# Patient Record
Sex: Female | Born: 1975
Health system: Southern US, Community
[De-identification: ages and names within clinical notes are randomized; demographics above are authoritative.]

## PROBLEM LIST (undated history)

## (undated) DIAGNOSIS — G4700 Insomnia, unspecified: Secondary | ICD-10-CM

## (undated) DIAGNOSIS — F329 Major depressive disorder, single episode, unspecified: Secondary | ICD-10-CM

## (undated) DIAGNOSIS — F32A Depression, unspecified: Secondary | ICD-10-CM

## (undated) DIAGNOSIS — F419 Anxiety disorder, unspecified: Secondary | ICD-10-CM

## (undated) DIAGNOSIS — E079 Disorder of thyroid, unspecified: Secondary | ICD-10-CM

## (undated) DIAGNOSIS — F3172 Bipolar disorder, in full remission, most recent episode hypomanic: Secondary | ICD-10-CM

## (undated) DIAGNOSIS — G629 Polyneuropathy, unspecified: Secondary | ICD-10-CM

## (undated) HISTORY — DX: Insomnia, unspecified: G47.00

## (undated) HISTORY — DX: Anxiety disorder, unspecified: F41.9

## (undated) HISTORY — DX: Polyneuropathy, unspecified: G62.9

## (undated) HISTORY — DX: Major depressive disorder, single episode, unspecified: F32.9

## (undated) HISTORY — DX: Bipolar disorder, in full remission, most recent episode hypomanic: F31.72

## (undated) HISTORY — DX: Depression, unspecified: F32.A

## (undated) HISTORY — PX: TUBAL LIGATION: SHX77

## (undated) HISTORY — DX: Disorder of thyroid, unspecified: E07.9

## (undated) HISTORY — PX: ABLATION: SHX5711

## (undated) HISTORY — PX: APPENDECTOMY: SHX54

---

## 2006-01-05 ENCOUNTER — Emergency Department (HOSPITAL_COMMUNITY): Admission: EM | Admit: 2006-01-05 | Discharge: 2006-01-05 | Payer: Self-pay | Admitting: Family Medicine

## 2013-10-23 ENCOUNTER — Ambulatory Visit: Payer: Self-pay | Admitting: Internal Medicine

## 2013-10-23 ENCOUNTER — Ambulatory Visit (INDEPENDENT_AMBULATORY_CARE_PROVIDER_SITE_OTHER): Payer: BC Managed Care – PPO | Admitting: Internal Medicine

## 2013-10-23 ENCOUNTER — Encounter: Payer: Self-pay | Admitting: Family Medicine

## 2013-10-23 ENCOUNTER — Encounter: Payer: Self-pay | Admitting: Internal Medicine

## 2013-10-23 VITALS — BP 110/70 | HR 94 | Temp 97.7°F | Ht 65.0 in | Wt 199.0 lb

## 2013-10-23 DIAGNOSIS — Z8261 Family history of arthritis: Secondary | ICD-10-CM

## 2013-10-23 DIAGNOSIS — Z0000 Encounter for general adult medical examination without abnormal findings: Secondary | ICD-10-CM

## 2013-10-23 DIAGNOSIS — Z840 Family history of diseases of the skin and subcutaneous tissue: Secondary | ICD-10-CM

## 2013-10-23 LAB — COMPREHENSIVE METABOLIC PANEL
ALT: 16 U/L (ref 0–35)
AST: 20 U/L (ref 0–37)
Alkaline Phosphatase: 86 U/L (ref 39–117)
Sodium: 138 mEq/L (ref 135–145)
Total Bilirubin: 0.5 mg/dL (ref 0.3–1.2)
Total Protein: 7.5 g/dL (ref 6.0–8.3)

## 2013-10-23 LAB — CBC
HCT: 38.6 % (ref 36.0–46.0)
MCHC: 32.9 g/dL (ref 30.0–36.0)
MCV: 78.7 fl (ref 78.0–100.0)
RDW: 15.9 % — ABNORMAL HIGH (ref 11.5–14.6)

## 2013-10-23 LAB — RHEUMATOID FACTOR: Rhuematoid fact SerPl-aCnc: <10 IU/mL (ref <=14)

## 2013-10-23 LAB — LIPID PANEL
Cholesterol: 176 mg/dL (ref 0–200)
LDL Cholesterol: 97 mg/dL (ref 0–99)
VLDL: 39.6 mg/dL (ref 0.0–40.0)

## 2013-10-23 LAB — TSH: TSH: 1.96 u[IU]/mL (ref 0.35–5.50)

## 2013-10-23 NOTE — Progress Notes (Signed)
HPI  Pt presents to the clinic today to establish care. She has no concerns today. She does have a remote history of thyroid disease and would like to have her thyroid checked.  Flu: 09/2013 Tetanus: 2014 LMP: 09/2013 Pap Smear: 2012  History reviewed. No pertinent past medical history.  Current Outpatient Prescriptions  Medication Sig Dispense Refill  . sertraline (ZOLOFT) 100 MG tablet Take 100 mg by mouth daily.        No current facility-administered medications for this visit.    No Known Allergies  History reviewed. No pertinent family history.  History   Social History  . Marital Status: Married    Spouse Name: N/A    Number of Children: N/A  . Years of Education: N/A   Occupational History  . Not on file.   Social History Main Topics  . Smoking status: Former Smoker    Types: Cigarettes    Quit date: 12/05/2007  . Smokeless tobacco: Never Used  . Alcohol Use: Yes  . Drug Use: No  . Sexual Activity: Not on file   Other Topics Concern  . Not on file   Social History Narrative  . No narrative on file    ROS:  Constitutional: Pt reports fatigue. Denies fever, malaise, headache or abrupt weight changes.  HEENT: Denies eye pain, eye redness, ear pain, ringing in the ears, wax buildup, runny nose, nasal congestion, bloody nose, or sore throat. Respiratory: Denies difficulty breathing, shortness of breath, cough or sputum production.   Cardiovascular: Denies chest pain, chest tightness, palpitations or swelling in the hands or feet.  Gastrointestinal: Denies abdominal pain, bloating, constipation, diarrhea or blood in the stool.  GU: Denies frequency, urgency, pain with urination, blood in urine, odor or discharge. Musculoskeletal: Pt reports low back pain. Denies decrease in range of motion, difficulty with gait, or joint pain and swelling.  Skin: Denies redness, rashes, lesions or ulcercations.  Neurological: Denies dizziness, difficulty with memory,  difficulty with speech or problems with balance and coordination.   No other specific complaints in a complete review of systems (except as listed in HPI above).  PE:  BP 110/70  Pulse 94  Temp(Src) 97.7 F (36.5 C) (Tympanic)  Ht 5\' 5"  (1.651 m)  Wt 199 lb (90.266 kg)  BMI 33.12 kg/m2  SpO2 99% Wt Readings from Last 3 Encounters:  10/23/13 199 lb (90.266 kg)    General: Appears her stated age, obese but well developed, well nourished in NAD. HEENT: Head: normal shape and size; Eyes: sclera white, no icterus, conjunctiva pink, PERRLA and EOMs intact; Ears: Tm's gray and intact, normal light reflex; Nose: mucosa pink and moist, septum midline; Throat/Mouth: Teeth present, mucosa pink and moist, no lesions or ulcerations noted.  Neck: Normal range of motion. Neck supple, trachea midline. No massses, lumps or thyromegaly present.  Cardiovascular: Normal rate and rhythm. S1,S2 noted.  No murmur, rubs or gallops noted. No JVD or BLE edema. No carotid bruits noted. Pulmonary/Chest: Normal effort and positive vesicular breath sounds. No respiratory distress. No wheezes, rales or ronchi noted.  Abdomen: Soft and nontender. Normal bowel sounds, no bruits noted. No distention or masses noted. Liver, spleen and kidneys non palpable. Musculoskeletal: Normal range of motion. No signs of joint swelling. No difficulty with gait.  Neurological: Alert and oriented. Cranial nerves II-XII intact. Coordination normal. +DTRs bilaterally. Psychiatric: Mood and affect normal. Behavior is normal. Judgment and thought content normal.      Assessment and Plan:  Preventative Health  Maintenance:  Will obtain screening labs today Pt will call her GYN to get her pap smear this year Try to work on your diet and exercise  Will check RF and ANA   RTC in 1 year for your annual exam

## 2013-10-23 NOTE — Progress Notes (Signed)
Pre-visit discussion using our clinic review tool. No additional management support is needed unless otherwise documented below in the visit note.  

## 2013-10-23 NOTE — Patient Instructions (Signed)

## 2013-10-24 LAB — ANTI-NUCLEAR AB-TITER (ANA TITER): ANA Titer 1: 1:40 {titer} — ABNORMAL HIGH

## 2013-10-27 ENCOUNTER — Encounter: Payer: Self-pay | Admitting: Family Medicine

## 2013-10-28 ENCOUNTER — Encounter: Payer: Self-pay | Admitting: *Deleted

## 2013-11-03 ENCOUNTER — Encounter: Payer: Self-pay | Admitting: Internal Medicine

## 2013-11-05 ENCOUNTER — Encounter: Payer: Self-pay | Admitting: Family Medicine

## 2013-11-05 ENCOUNTER — Ambulatory Visit (INDEPENDENT_AMBULATORY_CARE_PROVIDER_SITE_OTHER): Payer: BC Managed Care – PPO | Admitting: Family Medicine

## 2013-11-05 VITALS — BP 110/80 | HR 86 | Temp 98.1°F | Wt 197.0 lb

## 2013-11-05 DIAGNOSIS — R071 Chest pain on breathing: Secondary | ICD-10-CM

## 2013-11-05 DIAGNOSIS — R0789 Other chest pain: Secondary | ICD-10-CM

## 2013-11-05 NOTE — Progress Notes (Signed)
Pre-visit discussion using our clinic review tool. No additional management support is needed unless otherwise documented below in the visit note.  Pain inferior to the L breast.  Only on the L side.  No SSCP.  L triceps is occ sore.  Chest pain with deep inspiration and certain movements.  She can reproduce the pain.  No rash.  No trauma. No R sided pain.  No bruising.  She can get sob with exertion, unclear if this is related.  She is tired.  No heartburn.  No early CAD in the family.  No early death in the family.  No cocaine. No personal hx heart disease.  No syncope.  Not on birth control pills. No personal h/o DVT.  No calf swelling.   Meds, vitals, and allergies reviewed.   ROS: See HPI.  Otherwise, noncontributory.  nad ncat OP exam wnl Neck supple, no LA rrr ctab abd soft Back not ttp ttp just inferior to the L breast.  Pain at same spot with deep inspiration, better with compression during inhalation.  Calf not ttp x2, 40cm calf B

## 2013-11-05 NOTE — Patient Instructions (Signed)
Take ibuprofen with food and this should slowly get better.  Take care.  This looks to be a chest wall irritation.

## 2013-11-06 DIAGNOSIS — R0789 Other chest pain: Secondary | ICD-10-CM | POA: Insufficient documentation

## 2013-11-06 NOTE — Assessment & Plan Note (Signed)
Likely benign process. ddx d/w pt.  F/u prn.  Ibuprofen in meantime.  Well appearing.

## 2014-07-06 ENCOUNTER — Telehealth: Payer: Self-pay | Admitting: Internal Medicine

## 2014-07-06 ENCOUNTER — Encounter: Payer: Self-pay | Admitting: Family Medicine

## 2014-07-06 ENCOUNTER — Ambulatory Visit (INDEPENDENT_AMBULATORY_CARE_PROVIDER_SITE_OTHER): Payer: BC Managed Care – PPO | Admitting: Family Medicine

## 2014-07-06 VITALS — BP 90/70 | HR 91 | Temp 98.9°F | Ht 65.0 in | Wt 200.0 lb

## 2014-07-06 DIAGNOSIS — R079 Chest pain, unspecified: Secondary | ICD-10-CM

## 2014-07-06 DIAGNOSIS — M940 Chondrocostal junction syndrome [Tietze]: Secondary | ICD-10-CM

## 2014-07-06 MED ORDER — METHYLPREDNISOLONE 4 MG PO KIT: PACK | ORAL | Status: AC

## 2014-07-06 NOTE — Telephone Encounter (Signed)
Patient Information:  Caller Name: Raynelle FanningJulie  Phone: [phone]  Patient: Allison [name], [name]  Gender: Female  DOB: Feb 15, 1976  Age: 38 Years  PCP: Nicki ReaperBaity, Regina  Pregnant: No  Office Follow Up:  Does the office need to follow up with this patient?: No  Instructions For The Office: N/A  RN Note:  Unable to get appointment at [facility]. Will go to [facility] today.  Symptoms  Reason For Call & Symptoms: Has had Hx of neck and back pain considered mild  and  intermittent for about  2 years, does not remember an injury.  More frequent headaches for the last 2 weeks. Neck and back pain has been much worse since Saturday 8/1, can't get comfortable, can't rest, can't sit without pain in neck at shoulders and down to mid-back.  Would describe as moderate pain but noting some weakness in both arms, left worse - had problem opening can last night 8/2 and some tingle in left arm.  Reviewed Health History In EMR: Yes  Reviewed Medications In EMR: Yes  Reviewed Allergies In EMR: Yes  Reviewed Surgeries / Procedures: Yes  Date of Onset of Symptoms: 07/04/2014  Treatments Tried: ice   tylenol   neck rub  Treatments Tried Worked: No OB / GYN:  LMP: 06/10/2014  Guideline(s) Used:  Neck Pain or Stiffness  Disposition Per Guideline:   See Today in Office  Reason For Disposition Reached:   Numbness in an arm or hand (i.e., loss of sensation)  Advice Given:  N/A  Patient Will Follow Care Advice:  YES  Appointment Scheduled:  07/06/2014 15:00:00 Appointment Scheduled Provider: Dr Clent RidgesFry  Other

## 2014-07-06 NOTE — Progress Notes (Signed)
Pre visit review using our clinic review tool, if applicable. No additional management support is needed unless otherwise documented below in the visit note. 

## 2014-07-06 NOTE — Progress Notes (Signed)
   Subjective:    Patient ID: Maris BergerJulie A Kissick, female    DOB: [birth year], 38 y.o.   MRN: [id number]  HPI Here for 2 days of sharp intermittent chest pain which starts on both sides of the sternum and may radiate around both sides to the back. No SOB or cough. She has tried Ibuprofen with mixed results.    Review of Systems  Constitutional: Negative.   Respiratory: Negative.   Cardiovascular: Positive for chest pain. Negative for palpitations and leg swelling.       Objective:   Physical Exam  Constitutional: She appears well-developed and well-nourished. No distress.  Cardiovascular: Normal rate, regular rhythm, normal heart sounds and intact distal pulses.   EKG normal   Pulmonary/Chest: Effort normal and breath sounds normal. No respiratory distress. She has no wheezes. She has no rales.  She is tender along both sternal margins           Assessment & Plan:  Rest, heat. Given a Medrol dose pack

## 2014-07-07 ENCOUNTER — Ambulatory Visit: Payer: BC Managed Care – PPO | Admitting: Internal Medicine

## 2014-07-15 ENCOUNTER — Encounter: Payer: Self-pay | Admitting: Family Medicine

## 2014-07-15 NOTE — Telephone Encounter (Signed)
Make a follow up OV to discuss our next steps

## 2014-09-09 ENCOUNTER — Ambulatory Visit (INDEPENDENT_AMBULATORY_CARE_PROVIDER_SITE_OTHER): Payer: BC Managed Care – PPO

## 2014-09-09 DIAGNOSIS — Z23 Encounter for immunization: Secondary | ICD-10-CM

## 2014-10-08 ENCOUNTER — Ambulatory Visit: Payer: BC Managed Care – PPO | Admitting: Internal Medicine

## 2014-11-13 ENCOUNTER — Ambulatory Visit (INDEPENDENT_AMBULATORY_CARE_PROVIDER_SITE_OTHER): Payer: BC Managed Care – PPO | Admitting: Internal Medicine

## 2014-11-13 ENCOUNTER — Encounter: Payer: Self-pay | Admitting: Internal Medicine

## 2014-11-13 VITALS — BP 110/70 | HR 88 | Resp 12 | Ht 65.0 in | Wt 202.0 lb

## 2014-11-13 DIAGNOSIS — F4322 Adjustment disorder with anxiety: Secondary | ICD-10-CM

## 2014-11-13 DIAGNOSIS — F411 Generalized anxiety disorder: Secondary | ICD-10-CM | POA: Insufficient documentation

## 2014-11-13 MED ORDER — CLONAZEPAM 0.5 MG PO TABS: 0.5000 mg | ORAL_TABLET | Freq: Three times a day (TID) | ORAL | Status: DC | PRN

## 2014-11-13 MED ORDER — BUPROPION HCL ER (XL) 150 MG PO TB24: 150.0000 mg | ORAL_TABLET | Freq: Every day | ORAL | Status: DC

## 2014-11-13 NOTE — Assessment & Plan Note (Signed)
Recent worsening symptoms of anxiety, exacerbated by stressors at home caring for 3 young children. She has not tolerated escalation of Sertraline dose in the past. Will try adding Wellbutrin. Will also add Clonazepam tid prn. Discussed potential risks of these medications. Will set up follow up with psychiatry for evaluation and counseling. Follow up in 4 week and prn.

## 2014-11-13 NOTE — Patient Instructions (Signed)
Start Wellbutrin 150mg  daily in the morning.  Start Clonazepam 0.5mg  up to three times daily for anxiety.  We will set up evaluation with psychiatry.  Follow up in 4 weeks.

## 2014-11-13 NOTE — Progress Notes (Signed)
Pre visit review using our clinic review tool, if applicable. No additional management support is needed unless otherwise documented below in the visit note. 

## 2014-11-13 NOTE — Progress Notes (Signed)
Subjective:    Patient ID: Allison BergerJulie A Capelli, female    DOB: 1976/07/02, 38 y.o.   MRN: [id number]  HPI 38YO female presents for acute visit.  Started sertraline initially for post-partum depression. Has been on Sertraline for several years. Long history of depression symptoms, as far back as high school. Depression symptoms have been generally well controlled. However over last 1-2 months, feeling anxious and angry frequently. Having some behavioral issues with 37 year old, and notes increased stress at home caring for 22, 344 and 37 year old. Feels safe at home. Husband is supportive. Having frequent panic attacks with heart racing, sweating. Occur almost daily. Not taking anything for this.  Tried increasing Sertraline in the past, and had agitation. Unable to tolerate other SSRIs and Effexor.  Also stopped birth control Dec 2012. Had tubal ligation. Questions if hormones may be playing a role.   Past medical, surgical, family and social history per today's encounter.  Review of Systems  Constitutional: Negative for fever, chills, appetite change, fatigue and unexpected weight change.  Eyes: Negative for visual disturbance.  Respiratory: Negative for shortness of breath.   Cardiovascular: Positive for palpitations. Negative for chest pain and leg swelling.  Gastrointestinal: Negative for abdominal pain.  Skin: Negative for color change and rash.  Hematological: Negative for adenopathy. Does not bruise/bleed easily.  Psychiatric/Behavioral: Positive for sleep disturbance, dysphoric mood and agitation. Negative for suicidal ideas, hallucinations and self-injury. The patient is nervous/anxious.        Objective:    BP 110/70 mmHg  Pulse 88  Resp 12  Ht 5\' 5"  (1.651 m)  Wt 202 lb (91.627 kg)  BMI 33.61 kg/m2  SpO2 98% Physical Exam  Constitutional: She is oriented to person, place, and time. She appears well-developed and well-nourished. No distress.  HENT:  Head: Normocephalic  and atraumatic.  Right Ear: External ear normal.  Left Ear: External ear normal.  Nose: Nose normal.  Mouth/Throat: Oropharynx is clear and moist.  Eyes: Conjunctivae are normal. Pupils are equal, round, and reactive to light. Right eye exhibits no discharge. Left eye exhibits no discharge. No scleral icterus.  Neck: Normal range of motion. Neck supple. No tracheal deviation present. No thyromegaly present.  Cardiovascular: Normal rate, regular rhythm, normal heart sounds and intact distal pulses.  Exam reveals no gallop and no friction rub.   No murmur heard. Pulmonary/Chest: Effort normal and breath sounds normal. No accessory muscle usage. No tachypnea. No respiratory distress. She has no decreased breath sounds. She has no wheezes. She has no rhonchi. She has no rales. She exhibits no tenderness.  Musculoskeletal: Normal range of motion. She exhibits no edema or tenderness.  Lymphadenopathy:    She has no cervical adenopathy.  Neurological: She is alert and oriented to person, place, and time. No cranial nerve deficit. She exhibits normal muscle tone. Coordination normal.  Skin: Skin is warm and dry. No rash noted. She is not diaphoretic. No erythema. No pallor.  Psychiatric: Her behavior is normal. Judgment and thought content normal. Her mood appears anxious. Cognition and memory are normal. She exhibits a depressed mood. She expresses no suicidal ideation.          Assessment & Plan:  Over 25min of which >50% spent in face-to-face contact with patient discussing plan of care  Problem List Items Addressed This Visit      Unprioritized   Adjustment disorder with anxious mood - Primary    Recent worsening symptoms of anxiety, exacerbated by stressors  at home caring for 3 young children. She has not tolerated escalation of Sertraline dose in the past. Will try adding Wellbutrin. Will also add Clonazepam tid prn. Discussed potential risks of these medications. Will set up follow up  with psychiatry for evaluation and counseling. Follow up in 4 week and prn.    Relevant Medications      buPROPion (WELLBUTRIN XL) 24 hr tablet      clonazePAM (KLONOPIN)  tablet   Other Relevant Orders      Ambulatory referral to Psychiatry       Return in about 4 weeks (around 12/11/2014) for Recheck.

## 2014-11-17 ENCOUNTER — Encounter: Payer: Self-pay | Admitting: Internal Medicine

## 2014-12-17 ENCOUNTER — Encounter: Payer: Self-pay | Admitting: Internal Medicine

## 2014-12-17 ENCOUNTER — Ambulatory Visit (INDEPENDENT_AMBULATORY_CARE_PROVIDER_SITE_OTHER): Payer: BLUE CROSS/BLUE SHIELD | Admitting: Internal Medicine

## 2014-12-17 VITALS — BP 114/72 | HR 88 | Temp 98.2°F | Wt 197.0 lb

## 2014-12-17 DIAGNOSIS — R1030 Lower abdominal pain, unspecified: Secondary | ICD-10-CM

## 2014-12-17 DIAGNOSIS — R1011 Right upper quadrant pain: Secondary | ICD-10-CM

## 2014-12-17 DIAGNOSIS — Z8639 Personal history of other endocrine, nutritional and metabolic disease: Secondary | ICD-10-CM

## 2014-12-17 LAB — POCT URINALYSIS DIPSTICK
Bilirubin, UA: NEGATIVE
Glucose, UA: NEGATIVE
Ketones, UA: NEGATIVE
Nitrite, UA: NEGATIVE
Protein, UA: NEGATIVE
RBC UA: NEGATIVE
SPEC GRAV UA: 1.015
Urobilinogen, UA: NEGATIVE
pH, UA: 6

## 2014-12-17 LAB — COMPREHENSIVE METABOLIC PANEL
ALBUMIN: 4.4 g/dL (ref 3.5–5.2)
ALK PHOS: 105 U/L (ref 39–117)
ALT: 13 U/L (ref 0–35)
AST: 17 U/L (ref 0–37)
BUN: 12 mg/dL (ref 6–23)
CHLORIDE: 102 meq/L (ref 96–112)
CO2: 30 meq/L (ref 19–32)
Calcium: 9.7 mg/dL (ref 8.4–10.5)
Creatinine, Ser: 0.84 mg/dL (ref 0.40–1.20)
GFR: 81.06 mL/min (ref >60.00)
Glucose, Bld: 90 mg/dL (ref 70–99)
POTASSIUM: 3.9 meq/L (ref 3.5–5.1)
Sodium: 137 mEq/L (ref 135–145)
Total Bilirubin: 0.3 mg/dL (ref 0.2–1.2)
Total Protein: 8 g/dL (ref 6.0–8.3)

## 2014-12-17 LAB — CBC
HCT: 39.5 % (ref 36.0–46.0)
Hemoglobin: 12.9 g/dL (ref 12.0–15.0)
MCHC: 32.6 g/dL (ref 30.0–36.0)
MCV: 79.6 fl (ref 78.0–100.0)
Platelets: 338 10*3/uL (ref 150.0–400.0)
RBC: 4.96 Mil/uL (ref 3.87–5.11)
RDW: 15.2 % (ref 11.5–15.5)
WBC: 9.1 10*3/uL (ref 4.0–10.5)

## 2014-12-17 LAB — AMYLASE: Amylase: 45 U/L (ref 27–131)

## 2014-12-17 LAB — TSH: TSH: 1.99 u[IU]/mL (ref 0.35–4.50)

## 2014-12-17 LAB — LIPASE: LIPASE: 13 U/L (ref 11.0–59.0)

## 2014-12-17 NOTE — Patient Instructions (Signed)

## 2014-12-17 NOTE — Progress Notes (Signed)
HPI  [name] is a 39 y.o. female presenting to the clinic today with c/o intermittent nausea and abdominal pain x 2-3 months, increasing in frequency. Both the nausea and abdominal pain are usually in the evenings and occur approximately 2-4x per week.  The abdominal pain is dull and on a scale of 1-10, she reports a 3/10. She is having a BM 6-7x per week; normal in color and consistency. The nausea and abdominal pain is preventing her from eating dinner. Sometimes the pain is accompanied by burning in the middle of her upper chest region. She has tried Tums w/ minimal relief.  She reports a hx of Grave's disease and Irritable Bowel Syndrome. No change in diet or exercise prior to onset of symptoms. She has tried different foods to help w/ symptoms but has felt no improvement. Currently on Weight Watchers diet x 3 days.   Review of Systems  Past Medical History  Diagnosis Date  . Depression   . Thyroid disease     Family History  Problem Relation Age of Onset  . Mental illness Mother   . Arthritis Father   . Hypertension Father     History   Social History  . Marital Status: Married    Spouse Name: N/A    Number of Children: N/A  . Years of Education: N/A   Occupational History  . Not on file.   Social History Main Topics  . Smoking status: Former Smoker    Types: Cigarettes    Quit date: 12/05/2007  . Smokeless tobacco: Never Used  . Alcohol Use: Yes     Comment: occ  . Drug Use: No  . Sexual Activity: Yes    Birth Control/ Protection: Surgical   Other Topics Concern  . Not on file   Social History Narrative    No Known Allergies  Constitutional: Denies fever, malaise, fatigue, headache or abrupt weight changes.   GI: Positive nausea, acid reflux, epigastric pain. Denies vomiting, bowel changes. GU: Denies urgency, frequency and pain with urination, burning sensation, blood in urine, odor or discharge.  No other specific complaints in a complete review of  systems (except as listed in HPI above).    Objective:  Physical Exam  BP 114/72 mmHg  Pulse 88  Temp(Src) 98.2 F (36.8 C) (Oral)  Wt 197 lb (89.359 kg)  SpO2 98%  LMP 12/06/2014 Wt Readings from Last 3 Encounters:  12/17/14 197 lb (89.359 kg)  11/13/14 202 lb (91.627 kg)  07/06/14 200 lb (90.719 kg)    General: Appears her stated age, well developed, well nourished in NAD. HEENT: No thyromegaly noted. Cardiovascular: Normal rate and rhythm. S1,S2 noted.  No murmur, rubs or gallops noted.  Pulmonary/Chest: Normal effort and positive vesicular breath sounds. No respiratory distress. No wheezes, rales or ronchi noted.  Abdomen: Soft and nondistended. Normal bowel sounds, no bruits noted. No distention or masses noted. Liver, spleen and kidneys non palpable. Tender to shallow and deep palpation over the upper gastric region. Negative [place]phy Sign. No CVA tenderness.   Assessment & Plan:   Lower [Primary] & RUQ abdominal pain.  Urinalysis: 1+ leukocytes Will check CBC, CMET, lipase and amylase If labs normal, ? GERD, will start Prilosec  History of Graves Disease:  Will repeat TSH  RTC as needed or if symptoms persist.

## 2014-12-17 NOTE — Progress Notes (Signed)
Pre visit review using our clinic review tool, if applicable. No additional management support is needed unless otherwise documented below in the visit note. 

## 2015-02-03 ENCOUNTER — Encounter: Payer: Self-pay | Admitting: Family Medicine

## 2015-02-03 ENCOUNTER — Ambulatory Visit (INDEPENDENT_AMBULATORY_CARE_PROVIDER_SITE_OTHER): Payer: BLUE CROSS/BLUE SHIELD | Admitting: Family Medicine

## 2015-02-03 VITALS — BP 122/72 | HR 104 | Temp 98.4°F | Ht 65.0 in | Wt 193.0 lb

## 2015-02-03 DIAGNOSIS — J111 Influenza due to unidentified influenza virus with other respiratory manifestations: Secondary | ICD-10-CM | POA: Insufficient documentation

## 2015-02-03 DIAGNOSIS — J1189 Influenza due to unidentified influenza virus with other manifestations: Secondary | ICD-10-CM

## 2015-02-03 LAB — POCT INFLUENZA A/B
INFLUENZA B, POC: NEGATIVE
Influenza A, POC: NEGATIVE

## 2015-02-03 MED ORDER — OSELTAMIVIR PHOSPHATE 75 MG PO CAPS: 75.0000 mg | ORAL_CAPSULE | Freq: Two times a day (BID) | ORAL | Status: DC

## 2015-02-03 NOTE — Patient Instructions (Signed)
Drink lots of fluids and rest  Take tamiflu as directed  Try aleve twice daily with food as needed for fever and aches   If cough becomes productive - take mucinex to loosen congestion   Update if not starting to improve in a week or if worsening

## 2015-02-03 NOTE — Assessment & Plan Note (Signed)
Rapid flu test neg but classic symptoms  Cover with tamiflu-disc pros and cons Disc symptomatic care - see instructions on AVS  Aleve may help with food bid Expectorant prn Update if not starting to improve in a week or if worsening

## 2015-02-03 NOTE — Progress Notes (Signed)
Subjective:    Patient ID: Allison [name], female    DOB: September 26, 1976, 39 y.o.   MRN: [id number]  HPI Here for uri symptoms  Flu test is negative today   Achey all over - around 101 since yesterday  Started yesterday - mid day  Some diarrhea - no n/v -no severe   (one child at home has stomach bug and one has ear infx)  Slowed down now -has not eaten anything  Drinking lots of fluids   Chest is heavy and it is sore  Cough hurts- is deep and barky-not severe  Sore throat is mild  Some nasal cong with sinus headache    Took cold and flu med - and that brought it down     Patient Active Problem List   Diagnosis Date Noted  . Adjustment disorder with anxious mood 11/13/2014  . Chest wall pain 11/06/2013   Past Medical History  Diagnosis Date  . Depression   . Thyroid disease    Past Surgical History  Procedure Laterality Date  . Appendectomy    . Tubal ligation     History  Substance Use Topics  . Smoking status: Former Smoker    Types: Cigarettes    Quit date: 12/05/2007  . Smokeless tobacco: Never Used  . Alcohol Use: Yes     Comment: occ   Family History  Problem Relation Age of Onset  . Mental illness Mother   . Arthritis Father   . Hypertension Father    No Known Allergies Current Outpatient Prescriptions on File Prior to Visit  Medication Sig Dispense Refill  . buPROPion (WELLBUTRIN XL) 150 MG 24 hr tablet Take 1 tablet (150 mg total) by mouth daily. 30 tablet 3  . clonazePAM (KLONOPIN) 0.5 MG tablet Take 1 tablet (0.5 mg total) by mouth 3 (three) times daily as needed for anxiety. 90 tablet 3  . sertraline (ZOLOFT) 100 MG tablet Take 50 mg by mouth daily.      No current facility-administered medications on file prior to visit.    Review of Systems  Review of Systems  Constitutional: Negative for , appetite change, and unexpected weight change pos for fever and malaise  ENt pos for cong and rhinorrhea and sinus pressure .  Eyes: Negative for  pain and visual disturbance.  Respiratory: Negative for wheeze  and shortness of breath.   Cardiovascular: Negative for cp or palpitations    Gastrointestinal: Negative for nausea, diarrhea and constipation.  Genitourinary: Negative for urgency and frequency.  Skin: Negative for pallor or rash   Neurological: Negative for weakness, light-headedness, numbness and headaches.  Hematological: Negative for adenopathy. Does not bruise/bleed easily.  Psychiatric/Behavioral: Negative for dysphoric mood. The patient is not nervous/anxious.          Objective:   Physical Exam  Constitutional: She appears well-developed and well-nourished. No distress.  HENT:  Head: Normocephalic and atraumatic.  Right Ear: External ear normal.  Left Ear: External ear normal.  Mouth/Throat: No oropharyngeal exudate.  Nares are injected and congested  Throat mildly injected No sinus tenderness  Eyes: Conjunctivae and EOM are normal. Pupils are equal, round, and reactive to light. Right eye exhibits no discharge. Left eye exhibits no discharge.  Neck: Normal range of motion. Neck supple.  Cardiovascular: Regular rhythm and normal heart sounds.   Pulmonary/Chest: Effort normal and breath sounds normal. No respiratory distress. She has no wheezes. She has no rales.  Lymphadenopathy:    She has no cervical  adenopathy.  Neurological: She is alert.  Skin: Skin is warm and dry. No rash noted. No pallor.          Assessment & Plan:   Problem List Items Addressed This Visit      Respiratory   Influenza with respiratory manifestations - Primary    Rapid flu test neg but classic symptoms  Cover with tamiflu-disc pros and cons Disc symptomatic care - see instructions on AVS  Aleve may help with food bid Expectorant prn Update if not starting to improve in a week or if worsening        Relevant Medications   oseltamivir (TAMIFLU) capsule   Other Relevant Orders   POCT Influenza A/B (Completed)

## 2015-02-03 NOTE — Progress Notes (Signed)
Pre visit review using our clinic review tool, if applicable. No additional management support is needed unless otherwise documented below in the visit note. 

## 2015-03-09 ENCOUNTER — Telehealth: Payer: Self-pay | Admitting: Internal Medicine

## 2015-03-09 NOTE — Telephone Encounter (Signed)
[facility] - Client TELEPHONE ADVICE RECORD [facility] Patient Name: Allison GurneyJULIE [name] DOB: [birth year] Initial Comment Caller states has a cold, and ear is stopped up, wants to know what she can do for it Nurse Assessment Nurse: Apolinar JunesBrandon, RN, Darl PikesSusan Date/Time Lamount Cohen(Eastern Time): 03/09/2015 9:29:04 AM Confirm and document reason for call. If symptomatic, describe symptoms. ---Caller states has a cold, and ear is stopped up, wants to know what she can do for it - no pain just that muffled and seems blocked Has the patient traveled out of the country within the last 30 days? ---Not Applicable Does the patient require triage? ---Yes Related visit to physician within the last 2 weeks? ---No Does the PT have any chronic conditions? (i.e. diabetes, asthma, etc.) ---No Did the patient indicate they were pregnant? ---No Guidelines Guideline Title Affirmed Question Affirmed Notes Ear - Congestion Ear congestion (all triage questions negative) Final Disposition User [facility] [place], RN, Darl PikesSusan Comments Advised caller per Care Advise - she can try using OTC nasal decongestant for a day or 2 and see if that will help get opened back up - advised to use nasal saline spray in nose first then use the medicated spray - advised caller if this does not help or if her symptoms worsen (fever - headaches - facial swelling and pain ) she needs to call us back

## 2015-03-09 NOTE — Telephone Encounter (Signed)
Agree with advise given.

## 2015-05-18 ENCOUNTER — Encounter: Payer: Self-pay | Admitting: Internal Medicine

## 2015-05-18 ENCOUNTER — Ambulatory Visit (INDEPENDENT_AMBULATORY_CARE_PROVIDER_SITE_OTHER): Payer: BLUE CROSS/BLUE SHIELD | Admitting: Internal Medicine

## 2015-05-18 VITALS — BP 110/74 | HR 78 | Temp 98.2°F | Wt 195.0 lb

## 2015-05-18 DIAGNOSIS — M792 Neuralgia and neuritis, unspecified: Secondary | ICD-10-CM | POA: Diagnosis not present

## 2015-05-18 DIAGNOSIS — M542 Cervicalgia: Secondary | ICD-10-CM

## 2015-05-18 MED ORDER — IBUPROFEN 600 MG PO TABS: 600.0000 mg | ORAL_TABLET | Freq: Three times a day (TID) | ORAL | Status: DC | PRN

## 2015-05-18 MED ORDER — METHOCARBAMOL 500 MG PO TABS: 500.0000 mg | ORAL_TABLET | Freq: Every evening | ORAL | Status: DC | PRN

## 2015-05-18 NOTE — Progress Notes (Addendum)
Subjective:    Patient ID: Allison [name], female    DOB: February 13, 1976, 39 y.o.   MRN: [id number]  HPI  Pt presents to the clinic today with c/o neck pain. This started "out of the blue" 2 days ago. The pain radiates to her left shoulder blade and down her left arm, into her pinky and ring finger. She describes the pain as sharp and shooting. She does have some associated numbness and tingling as well. The pain seems to be getting worse instead of better. She denies any injury to the neck that she is aware of. She has taken Tylenol and Peppermint Oil without much relief.  Review of Systems  Past Medical History  Diagnosis Date  . Depression   . Thyroid disease     Current Outpatient Prescriptions  Medication Sig Dispense Refill  . buPROPion (WELLBUTRIN XL) 150 MG 24 hr tablet Take 1 tablet (150 mg total) by mouth daily. 30 tablet 3  . clonazePAM (KLONOPIN) 0.5 MG tablet Take 1 tablet (0.5 mg total) by mouth 3 (three) times daily as needed for anxiety. 90 tablet 3  . sertraline (ZOLOFT) 100 MG tablet Take 50 mg by mouth daily.      No current facility-administered medications for this visit.    No Known Allergies  Family History  Problem Relation Age of Onset  . Mental illness Mother   . Arthritis Father   . Hypertension Father     History   Social History  . Marital Status: Married    Spouse Name: N/A  . Number of Children: N/A  . Years of Education: N/A   Occupational History  . Not on file.   Social History Main Topics  . Smoking status: Former Smoker    Types: Cigarettes    Quit date: 12/05/2007  . Smokeless tobacco: Never Used  . Alcohol Use: Yes     Comment: occ  . Drug Use: No  . Sexual Activity: Yes    Birth Control/ Protection: Surgical   Other Topics Concern  . Not on file   Social History Narrative     Constitutional: Denies fever, malaise, fatigue, headache or abrupt weight changes.  Respiratory: Denies difficulty breathing, shortness of  breath, cough or sputum production.   Cardiovascular: Denies chest pain, chest tightness, palpitations or swelling in the hands or feet.  Musculoskeletal: Pt reports neck pain. Denies decrease in range of motion, difficulty with gait, muscle pain or joint swelling.  Neurological: Pt reports numbness and tingling in left arm. Denies dizziness, difficulty with memory, difficulty with speech or problems with balance and coordination.   No other specific complaints in a complete review of systems (except as listed in HPI above).     Objective:   Physical Exam   BP 110/74 mmHg  Pulse 78  Temp(Src) 98.2 F (36.8 C) (Oral)  Wt 195 lb (88.451 kg)  SpO2 99% Wt Readings from Last 3 Encounters:  05/18/15 195 lb (88.451 kg)  02/03/15 193 lb (87.544 kg)  12/17/14 197 lb (89.359 kg)    General: Appears her stated age, well developed, well nourished in NAD. HEENT: Head: normal shape and size; Eyes: sclera white, no icterus, conjunctiva pink, PERRLA and EOMs intact; Ears:  Cardiovascular: Normal rate and rhythm. S1,S2 noted. Radial pulses 2+ bilaterally. Cap refill < 3 secs. Pulmonary/Chest: Normal effort and positive vesicular breath sounds. No respiratory distress. No wheezes, rales or ronchi noted.  Musculoskeletal: Normal flexion and extension of the cervical spine. Decreased rotation  to the left, normal rotation to the right. No pain with palpation over the cervical spine. Strength 5/5 BUE. Neurological: Alert and oriented. Coordination normal.   BMET    Component Value Date/Time   NA 137 12/17/2014 1418   K 3.9 12/17/2014 1418   CL 102 12/17/2014 1418   CO2 30 12/17/2014 1418   GLUCOSE 90 12/17/2014 1418   BUN 12 12/17/2014 1418   CREATININE 0.84 12/17/2014 1418   CALCIUM 9.7 12/17/2014 1418    Lipid Panel     Component Value Date/Time   CHOL 176 10/23/2013 1003   TRIG 198.0* 10/23/2013 1003   HDL 39.90 10/23/2013 1003   CHOLHDL 4 10/23/2013 1003   VLDL 39.6 10/23/2013 1003     LDLCALC 97 10/23/2013 1003    CBC    Component Value Date/Time   WBC 9.1 12/17/2014 1418   RBC 4.96 12/17/2014 1418   HGB 12.9 12/17/2014 1418   HCT 39.5 12/17/2014 1418   PLT 338.0 12/17/2014 1418   MCV 79.6 12/17/2014 1418   MCHC 32.6 12/17/2014 1418   RDW 15.2 12/17/2014 1418    Hgb A1C No results found for: HGBA1C      Assessment & Plan:   Neck pain with left arm radicular pain:  Neck exercises given eRx for Advil 600 mg TID prn with food eRx for Robaxin 500 mg QHS prn If pain persist or worsen, will obtain an xray of her cervical spine  RTC as needed or if pain persist or worsens

## 2015-05-18 NOTE — Patient Instructions (Addendum)
I have called in Ibuprofen to your pharmacy, take 3 times per day with food, but only if needed I have called in a muscle relaxer to your pharmacy, take only at night to help you sleep Neck exercises as shown to you in the office visit Return by Friday if pain persist or worsen  Radicular Pain Radicular pain in either the arm or leg is usually from a bulging or herniated disk in the spine. A piece of the herniated disk may press against the nerves as the nerves exit the spine. This causes pain which is felt at the tips of the nerves down the arm or leg. Other causes of radicular pain may include:  Fractures.  Heart disease.  Cancer.  An abnormal and usually degenerative state of the nervous system or nerves (neuropathy). Diagnosis may require CT or MRI scanning to determine the primary cause.  Nerves that start at the neck (nerve roots) may cause radicular pain in the outer shoulder and arm. It can spread down to the thumb and fingers. The symptoms vary depending on which nerve root has been affected. In most cases radicular pain improves with conservative treatment. Neck problems may require physical therapy, a neck collar, or cervical traction. Treatment may take many weeks, and surgery may be considered if the symptoms do not improve.  Conservative treatment is also recommended for sciatica. Sciatica causes pain to radiate from the lower back or buttock area down the leg into the foot. Often there is a history of back problems. Most patients with sciatica are better after 2 to 4 weeks of rest and other supportive care. Short term bed rest can reduce the disk pressure considerably. Sitting, however, is not a good position since this increases the pressure on the disk. You should avoid bending, lifting, and all other activities which make the problem worse. Traction can be used in severe cases. Surgery is usually reserved for patients who do not improve within the first months of treatment. Only  take over-the-counter or prescription medicines for pain, discomfort, or fever as directed by your caregiver. Narcotics and muscle relaxants may help by relieving more severe pain and spasm and by providing mild sedation. Cold or massage can give significant relief. Spinal manipulation is not recommended. It can increase the degree of disc protrusion. Epidural steroid injections are often effective treatment for radicular pain. These injections deliver medicine to the spinal nerve in the space between the protective covering of the spinal cord and back bones (vertebrae). Your caregiver can give you more information about steroid injections. These injections are most effective when given within two weeks of the onset of pain.  You should see your caregiver for follow up care as recommended. A program for neck and back injury rehabilitation with stretching and strengthening exercises is an important part of management.  SEEK IMMEDIATE MEDICAL CARE IF:  You develop increased pain, weakness, or numbness in your arm or leg.  You develop difficulty with bladder or bowel control.  You develop abdominal pain. Document Released: 12/28/2004 Document Revised: 02/12/2012 Document Reviewed: 03/15/2009 [facility] Patient Information 2015 [place], [place]. This information is not intended to replace advice given to you by your health care provider. Make sure you discuss any questions you have with your health care provider.

## 2015-05-18 NOTE — Addendum Note (Signed)
Addended by: Lorre Munroe on: 05/18/2015 12:00 PM   Modules accepted: Kipp Brood

## 2015-05-18 NOTE — Progress Notes (Signed)
Pre visit review using our clinic review tool, if applicable. No additional management support is needed unless otherwise documented below in the visit note. 

## 2015-05-24 ENCOUNTER — Ambulatory Visit (INDEPENDENT_AMBULATORY_CARE_PROVIDER_SITE_OTHER)
Admission: RE | Admit: 2015-05-24 | Discharge: 2015-05-24 | Disposition: A | Discharge status: Home / Self Care | Payer: BLUE CROSS/BLUE SHIELD | Source: Ambulatory Visit | Attending: Internal Medicine | Admitting: Internal Medicine

## 2015-05-24 ENCOUNTER — Ambulatory Visit (INDEPENDENT_AMBULATORY_CARE_PROVIDER_SITE_OTHER): Payer: BLUE CROSS/BLUE SHIELD | Admitting: Internal Medicine

## 2015-05-24 ENCOUNTER — Encounter: Payer: Self-pay | Admitting: Internal Medicine

## 2015-05-24 VITALS — BP 108/76 | HR 73 | Temp 98.5°F | Wt 196.0 lb

## 2015-05-24 DIAGNOSIS — M542 Cervicalgia: Secondary | ICD-10-CM

## 2015-05-24 DIAGNOSIS — M546 Pain in thoracic spine: Secondary | ICD-10-CM

## 2015-05-24 NOTE — Progress Notes (Addendum)
Subjective:    Patient ID: Allison [name], female    DOB: 02-Aug-1976, 39 y.o.   MRN: [id number]  HPI  Pt presents to the clinic today to follow up neck pain. She continues to have pain in her neck and upper back. She describes the pain as achy. She feels like it is her bones and not her muscles. The pain is her shoulder and arm much better. She no longer has any tingling. She has taken the Ibuprofen and Robaxin with some relief. She reports the Robaxin makes her too sleepy. She is concerned because the pain is not getting any better. She has trouble bending over the sink to wash the dishes. The pain is starting to interfere with her ADL's and she wants to know what else can be done.  Review of Systems  Past Medical History  Diagnosis Date  . Depression   . Thyroid disease     Current Outpatient Prescriptions  Medication Sig Dispense Refill  . buPROPion (WELLBUTRIN XL) 150 MG 24 hr tablet Take 1 tablet (150 mg total) by mouth daily. 30 tablet 3  . clonazePAM (KLONOPIN) 0.5 MG tablet Take 1 tablet (0.5 mg total) by mouth 3 (three) times daily as needed for anxiety. 90 tablet 3  . ibuprofen (ADVIL,MOTRIN) 600 MG tablet Take 1 tablet (600 mg total) by mouth every 8 (eight) hours as needed. 30 tablet 0  . methocarbamol (ROBAXIN) 500 MG tablet Take 1 tablet (500 mg total) by mouth at bedtime as needed for muscle spasms. 30 tablet 0  . sertraline (ZOLOFT) 100 MG tablet Take 50 mg by mouth daily.      No current facility-administered medications for this visit.    No Known Allergies  Family History  Problem Relation Age of Onset  . Mental illness Mother   . Arthritis Father   . Hypertension Father     History   Social History  . Marital Status: Married    Spouse Name: N/A  . Number of Children: N/A  . Years of Education: N/A   Occupational History  . Not on file.   Social History Main Topics  . Smoking status: Former Smoker    Types: Cigarettes    Quit date: 12/05/2007    . Smokeless tobacco: Never Used  . Alcohol Use: Yes     Comment: occ  . Drug Use: No  . Sexual Activity: Yes    Birth Control/ Protection: Surgical   Other Topics Concern  . Not on file   Social History Narrative     Constitutional: Denies fever, malaise, fatigue, headache or abrupt weight changes.  Respiratory: Denies difficulty breathing, shortness of breath, cough or sputum production.   Cardiovascular: Denies chest pain, chest tightness, palpitations or swelling in the hands or feet.  Musculoskeletal: Pt reports neck/back pain. Denies decrease in range of motion, difficulty with gait, muscle pain or joint swelling.  Neurological: Denies dizziness, difficulty with memory, difficulty with speech or problems with balance and coordination.   No other specific complaints in a complete review of systems (except as listed in HPI above).     Objective:   Physical Exam   BP 108/76 mmHg  Pulse 73  Temp(Src) 98.5 F (36.9 C) (Oral)  Wt 196 lb (88.905 kg)  SpO2 99% Wt Readings from Last 3 Encounters:  05/24/15 196 lb (88.905 kg)  05/18/15 195 lb (88.451 kg)  02/03/15 193 lb (87.544 kg)    General: Appears her stated age, well developed, well  nourished in NAD. HEENT: Head: normal shape and size; Eyes: sclera white, no icterus, conjunctiva pink, PERRLA and EOMs intact; Ears:  Cardiovascular: Normal rate and rhythm. S1,S2 noted. Pulmonary/Chest: Normal effort and positive vesicular breath sounds. No respiratory distress. No wheezes, rales or ronchi noted.  Musculoskeletal: Normal flexion and extension of the cervical spine. Decreased rotation to the left, normal rotation to the right. No pain with palpation over the cervical spine. Strength 5/5 BUE.Normal flexion, extension and rotation of the lumbar spine. No pain with palpation over the thoracic spine. Strength 5/5 BLE. Neurological: Alert and oriented. Coordination normal.   BMET    Component Value Date/Time   NA 137  12/17/2014 1418   K 3.9 12/17/2014 1418   CL 102 12/17/2014 1418   CO2 30 12/17/2014 1418   GLUCOSE 90 12/17/2014 1418   BUN 12 12/17/2014 1418   CREATININE 0.84 12/17/2014 1418   CALCIUM 9.7 12/17/2014 1418    Lipid Panel     Component Value Date/Time   CHOL 176 10/23/2013 1003   TRIG 198.0* 10/23/2013 1003   HDL 39.90 10/23/2013 1003   CHOLHDL 4 10/23/2013 1003   VLDL 39.6 10/23/2013 1003   LDLCALC 97 10/23/2013 1003    CBC    Component Value Date/Time   WBC 9.1 12/17/2014 1418   RBC 4.96 12/17/2014 1418   HGB 12.9 12/17/2014 1418   HCT 39.5 12/17/2014 1418   PLT 338.0 12/17/2014 1418   MCV 79.6 12/17/2014 1418   MCHC 32.6 12/17/2014 1418   RDW 15.2 12/17/2014 1418    Hgb A1C No results found for: HGBA1C      Assessment & Plan:   Neck/thoracic back pain:  Xray of cervical spine today Xray of thoracic spine today Continue Ibuprofen and Robaxin as needed  Will follow up after xrays, may need MRI

## 2015-05-24 NOTE — Progress Notes (Signed)
Pre visit review using our clinic review tool, if applicable. No additional management support is needed unless otherwise documented below in the visit note. 

## 2015-05-24 NOTE — Patient Instructions (Signed)
Cervical Strain and Sprain (Whiplash) with Rehab Cervical strain and sprain are injuries that commonly occur with "whiplash" injuries. Whiplash occurs when the neck is forcefully whipped backward or forward, such as during a motor vehicle accident or during contact sports. The muscles, ligaments, tendons, discs, and nerves of the neck are susceptible to injury when this occurs. RISK FACTORS Risk of having a whiplash injury increases if:  Osteoarthritis of the spine.  Situations that make head or neck accidents or trauma more likely.  High-risk sports (football, rugby, wrestling, hockey, auto racing, gymnastics, diving, contact karate, or boxing).  Poor strength and flexibility of the neck.  Previous neck injury.  Poor tackling technique.  Improperly fitted or padded equipment. SYMPTOMS   Pain or stiffness in the front or back of neck or both.  Symptoms may present immediately or up to 24 hours after injury.  Dizziness, headache, nausea, and vomiting.  Muscle spasm with soreness and stiffness in the neck.  Tenderness and swelling at the injury site. PREVENTION  Learn and use proper technique (avoid tackling with the head, spearing, and head-butting; use proper falling techniques to avoid landing on the head).  Warm up and stretch properly before activity.  Maintain physical fitness:  Strength, flexibility, and endurance.  Cardiovascular fitness.  Wear properly fitted and padded protective equipment, such as padded soft collars, for participation in contact sports. PROGNOSIS  Recovery from cervical strain and sprain injuries is dependent on the extent of the injury. These injuries are usually curable in 1 week to 3 months with appropriate treatment.  RELATED COMPLICATIONS   Temporary numbness and weakness may occur if the nerve roots are damaged, and this may persist until the nerve has completely healed.  Chronic pain due to frequent recurrence of  symptoms.  Prolonged healing, especially if activity is resumed too soon (before complete recovery). TREATMENT  Treatment initially involves the use of ice and medication to help reduce pain and inflammation. It is also important to perform strengthening and stretching exercises and modify activities that worsen symptoms so the injury does not get worse. These exercises may be performed at home or with a therapist. For patients who experience severe symptoms, a soft, padded collar may be recommended to be worn around the neck.  Improving your posture may help reduce symptoms. Posture improvement includes pulling your chin and abdomen in while sitting or standing. If you are sitting, sit in a firm chair with your buttocks against the back of the chair. While sleeping, try replacing your pillow with a small towel rolled to 2 inches in diameter, or use a cervical pillow or soft cervical collar. Poor sleeping positions delay healing.  For patients with nerve root damage, which causes numbness or weakness, the use of a cervical traction apparatus may be recommended. Surgery is rarely necessary for these injuries. However, cervical strain and sprains that are present at birth (congenital) may require surgery. MEDICATION   If pain medication is necessary, nonsteroidal anti-inflammatory medications, such as aspirin and ibuprofen, or other minor pain relievers, such as acetaminophen, are often recommended.  Do not take pain medication for 7 days before surgery.  Prescription pain relievers may be given if deemed necessary by your caregiver. Use only as directed and only as much as you need. HEAT AND COLD:   Cold treatment (icing) relieves pain and reduces inflammation. Cold treatment should be applied for 10 to 15 minutes every 2 to 3 hours for inflammation and pain and immediately after any activity that aggravates   your symptoms. Use ice packs or an ice massage.  Heat treatment may be used prior to  performing the stretching and strengthening activities prescribed by your caregiver, physical therapist, or athletic trainer. Use a heat pack or a warm soak. SEEK MEDICAL CARE IF:   Symptoms get worse or do not improve in 2 weeks despite treatment.  New, unexplained symptoms develop (drugs used in treatment may produce side effects). EXERCISES RANGE OF MOTION (ROM) AND STRETCHING EXERCISES - Cervical Strain and Sprain These exercises may help you when beginning to rehabilitate your injury. In order to successfully resolve your symptoms, you must improve your posture. These exercises are designed to help reduce the forward-head and rounded-shoulder posture which contributes to this condition. Your symptoms may resolve with or without further involvement from your physician, physical therapist or athletic trainer. While completing these exercises, remember:   Restoring tissue flexibility helps normal motion to return to the joints. This allows healthier, less painful movement and activity.  An effective stretch should be held for at least 20 seconds, although you may need to begin with shorter hold times for comfort.  A stretch should never be painful. You should only feel a gentle lengthening or release in the stretched tissue. STRETCH- Axial Extensors  Lie on your back on the floor. You may bend your knees for comfort. Place a rolled-up hand towel or dish towel, about 2 inches in diameter, under the part of your head that makes contact with the floor.  Gently tuck your chin, as if trying to make a "double chin," until you feel a gentle stretch at the base of your head.  Hold __________ seconds. Repeat __________ times. Complete this exercise __________ times per day.  STRETCH - Axial Extension   Stand or sit on a firm surface. Assume a good posture: chest up, shoulders drawn back, abdominal muscles slightly tense, knees unlocked (if standing) and feet hip width apart.  Slowly retract your  chin so your head slides back and your chin slightly lowers. Continue to look straight ahead.  You should feel a gentle stretch in the back of your head. Be certain not to feel an aggressive stretch since this can cause headaches later.  Hold for __________ seconds. Repeat __________ times. Complete this exercise __________ times per day. STRETCH - Cervical Side Bend   Stand or sit on a firm surface. Assume a good posture: chest up, shoulders drawn back, abdominal muscles slightly tense, knees unlocked (if standing) and feet hip width apart.  Without letting your nose or shoulders move, slowly tip your right / left ear to your shoulder until your feel a gentle stretch in the muscles on the opposite side of your neck.  Hold __________ seconds. Repeat __________ times. Complete this exercise __________ times per day. STRETCH - Cervical Rotators   Stand or sit on a firm surface. Assume a good posture: chest up, shoulders drawn back, abdominal muscles slightly tense, knees unlocked (if standing) and feet hip width apart.  Keeping your eyes level with the ground, slowly turn your head until you feel a gentle stretch along the back and opposite side of your neck.  Hold __________ seconds. Repeat __________ times. Complete this exercise __________ times per day. RANGE OF MOTION - Neck Circles   Stand or sit on a firm surface. Assume a good posture: chest up, shoulders drawn back, abdominal muscles slightly tense, knees unlocked (if standing) and feet hip width apart.  Gently roll your head down and around from the   back of one shoulder to the back of the other. The motion should never be forced or painful.  Repeat the motion 10-20 times, or until you feel the neck muscles relax and loosen. Repeat __________ times. Complete the exercise __________ times per day. STRENGTHENING EXERCISES - Cervical Strain and Sprain These exercises may help you when beginning to rehabilitate your injury. They may  resolve your symptoms with or without further involvement from your physician, physical therapist, or athletic trainer. While completing these exercises, remember:   Muscles can gain both the endurance and the strength needed for everyday activities through controlled exercises.  Complete these exercises as instructed by your physician, physical therapist, or athletic trainer. Progress the resistance and repetitions only as guided.  You may experience muscle soreness or fatigue, but the pain or discomfort you are trying to eliminate should never worsen during these exercises. If this pain does worsen, stop and make certain you are following the directions exactly. If the pain is still present after adjustments, discontinue the exercise until you can discuss the trouble with your clinician. STRENGTH - Cervical Flexors, Isometric  Face a wall, standing about 6 inches away. Place a small pillow, a ball about 6-8 inches in diameter, or a folded towel between your forehead and the wall.  Slightly tuck your chin and gently push your forehead into the soft object. Push only with mild to moderate intensity, building up tension gradually. Keep your jaw and forehead relaxed.  Hold 10 to 20 seconds. Keep your breathing relaxed.  Release the tension slowly. Relax your neck muscles completely before you start the next repetition. Repeat __________ times. Complete this exercise __________ times per day. STRENGTH- Cervical Lateral Flexors, Isometric   Stand about 6 inches away from a wall. Place a small pillow, a ball about 6-8 inches in diameter, or a folded towel between the side of your head and the wall.  Slightly tuck your chin and gently tilt your head into the soft object. Push only with mild to moderate intensity, building up tension gradually. Keep your jaw and forehead relaxed.  Hold 10 to 20 seconds. Keep your breathing relaxed.  Release the tension slowly. Relax your neck muscles completely  before you start the next repetition. Repeat __________ times. Complete this exercise __________ times per day. STRENGTH - Cervical Extensors, Isometric   Stand about 6 inches away from a wall. Place a small pillow, a ball about 6-8 inches in diameter, or a folded towel between the back of your head and the wall.  Slightly tuck your chin and gently tilt your head back into the soft object. Push only with mild to moderate intensity, building up tension gradually. Keep your jaw and forehead relaxed.  Hold 10 to 20 seconds. Keep your breathing relaxed.  Release the tension slowly. Relax your neck muscles completely before you start the next repetition. Repeat __________ times. Complete this exercise __________ times per day. POSTURE AND BODY MECHANICS CONSIDERATIONS - Cervical Strain and Sprain Keeping correct posture when sitting, standing or completing your activities will reduce the stress put on different body tissues, allowing injured tissues a chance to heal and limiting painful experiences. The following are general guidelines for improved posture. Your physician or physical therapist will provide you with any instructions specific to your needs. While reading these guidelines, remember:  The exercises prescribed by your provider will help you have the flexibility and strength to maintain correct postures.  The correct posture provides the optimal environment for your joints to   work. All of your joints have less wear and tear when properly supported by a spine with good posture. This means you will experience a healthier, less painful body.  Correct posture must be practiced with all of your activities, especially prolonged sitting and standing. Correct posture is as important when doing repetitive low-stress activities (typing) as it is when doing a single heavy-load activity (lifting). PROLONGED STANDING WHILE SLIGHTLY LEANING FORWARD When completing a task that requires you to lean  forward while standing in one place for a long time, place either foot up on a stationary 2- to 4-inch high object to help maintain the best posture. When both feet are on the ground, the low back tends to lose its slight inward curve. If this curve flattens (or becomes too large), then the back and your other joints will experience too much stress, fatigue more quickly, and can cause pain.  RESTING POSITIONS Consider which positions are most painful for you when choosing a resting position. If you have pain with flexion-based activities (sitting, bending, stooping, squatting), choose a position that allows you to rest in a less flexed posture. You would want to avoid curling into a fetal position on your side. If your pain worsens with extension-based activities (prolonged standing, working overhead), avoid resting in an extended position such as sleeping on your stomach. Most people will find more comfort when they rest with their spine in a more neutral position, neither too rounded nor too arched. Lying on a non-sagging bed on your side with a pillow between your knees, or on your back with a pillow under your knees will often provide some relief. Keep in mind, being in any one position for a prolonged period of time, no matter how correct your posture, can still lead to stiffness. WALKING Walk with an upright posture. Your ears, shoulders, and hips should all line up. OFFICE WORK When working at a desk, create an environment that supports good, upright posture. Without extra support, muscles fatigue and lead to excessive strain on joints and other tissues. CHAIR:  A chair should be able to slide under your desk when your back makes contact with the back of the chair. This allows you to work closely.  The chair's height should allow your eyes to be level with the upper part of your monitor and your hands to be slightly lower than your elbows.  Body position:  Your feet should make contact with the  floor. If this is not possible, use a foot rest.  Keep your ears over your shoulders. This will reduce stress on your neck and low back. Document Released: 11/20/2005 Document Revised: 04/06/2014 Document Reviewed: 03/04/2009 [employer] Patient Information 2015 [employer]. This information is not intended to replace advice given to you by your health care provider. Make sure you discuss any questions you have with your health care provider.  

## 2015-05-27 ENCOUNTER — Telehealth: Payer: Self-pay | Admitting: Internal Medicine

## 2015-05-27 NOTE — Telephone Encounter (Signed)
We can try a daily medication to help reduce the inflammation caused by the Arthritis called Meloxicam. Alternatively she can try Aleve once a day. Her other option would be to see a orthopedist for further evaluation and intervention.

## 2015-05-27 NOTE — Telephone Encounter (Signed)
Pt is wanting to know what next steps are since the xray has been done. Please call pt back.thanks.

## 2015-05-27 NOTE — Telephone Encounter (Signed)
Pt is aware and she states she will try OTC Aleve and call us back if it does not help

## 2015-06-17 ENCOUNTER — Ambulatory Visit (INDEPENDENT_AMBULATORY_CARE_PROVIDER_SITE_OTHER): Payer: BLUE CROSS/BLUE SHIELD | Admitting: Family Medicine

## 2015-06-17 ENCOUNTER — Encounter: Payer: Self-pay | Admitting: Family Medicine

## 2015-06-17 VITALS — BP 106/80 | HR 78 | Ht 65.0 in | Wt 202.0 lb

## 2015-06-17 DIAGNOSIS — M999 Biomechanical lesion, unspecified: Secondary | ICD-10-CM | POA: Insufficient documentation

## 2015-06-17 DIAGNOSIS — M542 Cervicalgia: Secondary | ICD-10-CM

## 2015-06-17 DIAGNOSIS — M9902 Segmental and somatic dysfunction of thoracic region: Secondary | ICD-10-CM

## 2015-06-17 DIAGNOSIS — M899 Disorder of bone, unspecified: Secondary | ICD-10-CM

## 2015-06-17 DIAGNOSIS — M9908 Segmental and somatic dysfunction of rib cage: Secondary | ICD-10-CM | POA: Diagnosis not present

## 2015-06-17 DIAGNOSIS — M9901 Segmental and somatic dysfunction of cervical region: Secondary | ICD-10-CM

## 2015-06-17 NOTE — Patient Instructions (Addendum)
Good to see you.  Ice 20 minutes 2 times daily. Usually after activity and before bed. Exercises 3 times a week.  Vitamin D 2000 IU daily Turmeric  daily Fish oil 2 grams daily Tart cherry extract nightly Conitnue to stay active! Heels, butt shoulder and head for goal of 5 minutes daily See me again in 3 weeks.   Standing:  Secure a rubber exercise band/tubing so that it is at the height of your shoulders when you are either standing or sitting on a firm arm-less chair.  Grasp an end of the band/tubing in each hand and have your palms face each other. Straighten your elbows and lift your hands straight in front of you at shoulder height. Step back away from the secured end of band/tubing until it becomes tense.  Squeeze your shoulder blades together. Keeping your elbows locked and your hands at shoulder-height, bring your hands out to your side.  Hold __________ seconds. Slowly ease the tension on the band/tubing as you reverse the directions and return to the starting position. Repeat __________ times. Complete this exercise __________ times per day. STRENGTH - Scapular Retractors  Secure a rubber exercise band/tubing so that it is at the height of your shoulders when you are either standing or sitting on a firm arm-less chair.  With a palm-down grip, grasp an end of the band/tubing in each hand. Straighten your elbows and lift your hands straight in front of you at shoulder height. Step back away from the secured end of band/tubing until it becomes tense.  Squeezing your shoulder blades together, draw your elbows back as you bend them. Keep your upper arm lifted away from your body throughout the exercise.  Hold __________ seconds. Slowly ease the tension on the band/tubing as you reverse the directions and return to the starting position. Repeat __________ times. Complete this exercise __________ times per day. STRENGTH - Shoulder Extensors   Secure a rubber exercise  band/tubing so that it is at the height of your shoulders when you are either standing or sitting on a firm arm-less chair.  With a thumbs-up grip, grasp an end of the band/tubing in each hand. Straighten your elbows and lift your hands straight in front of you at shoulder height. Step back away from the secured end of band/tubing until it becomes tense.  Squeezing your shoulder blades together, pull your hands down to the sides of your thighs. Do not allow your hands to go behind you.  Hold for __________ seconds. Slowly ease the tension on the band/tubing as you reverse the directions and return to the starting position. Repeat __________ times. Complete this exercise __________ times per day.  STRENGTH - Scapular Retractors and External Rotators  Secure a rubber exercise band/tubing so that it is at the height of your shoulders when you are either standing or sitting on a firm arm-less chair.  With a palm-down grip, grasp an end of the band/tubing in each hand. Bend your elbows 90 degrees and lift your elbows to shoulder height at your sides. Step back away from the secured end of band/tubing until it becomes tense.  Squeezing your shoulder blades together, rotate your shoulder so that your upper arm and elbow remain stationary, but your fists travel upward to head-height.  Hold __________ for seconds. Slowly ease the tension on the band/tubing as you reverse the directions and return to the starting position. Repeat __________ times. Complete this exercise __________ times per day.  STRENGTH - Scapular Retractors and External  Rotators, Rowing  Secure a rubber exercise band/tubing so that it is at the height of your shoulders when you are either standing or sitting on a firm arm-less chair.  With a palm-down grip, grasp an end of the band/tubing in each hand. Straighten your elbows and lift your hands straight in front of you at shoulder height. Step back away from the secured end of  band/tubing until it becomes tense.  Step 1: Squeeze your shoulder blades together. Bending your elbows, draw your hands to your chest as if you are rowing a boat. At the end of this motion, your hands and elbow should be at shoulder-height and your elbows should be out to your sides.  Step 2: Rotate your shoulder to raise your hands above your head. Your forearms should be vertical and your upper-arms should be horizontal.  Hold for __________ seconds. Slowly ease the tension on the band/tubing as you reverse the directions and return to the starting position. Repeat __________ times. Complete this exercise __________ times per day.  STRENGTH - Scapular Retractors and Elevators  Secure a rubber exercise band/tubing so that it is at the height of your shoulders when you are either standing or sitting on a firm arm-less chair.  With a thumbs-up grip, grasp an end of the band/tubing in each hand. Step back away from the secured end of band/tubing until it becomes tense.  Squeezing your shoulder blades together, straighten your elbows and lift your hands straight over your head.  Hold for __________ seconds. Slowly ease the tension on the band/tubing as you reverse the directions and return to the starting position. Repeat __________ times. Complete this exercise __________ times per day.  Document Released: 11/20/2005 Document Revised: 02/12/2012 Document Reviewed: 03/04/2009 [facility] IncExitCare Patient Information 2015 [place], [place]. This information is not intended to replace advice given to you by your health care provider. Make sure you discuss any questions you have with your health care provider.

## 2015-06-17 NOTE — Progress Notes (Signed)
Pre visit review using our clinic review tool, if applicable. No additional management support is needed unless otherwise documented below in the visit note. 

## 2015-06-17 NOTE — Progress Notes (Signed)
[name] [address] [place], [place] [address] Phone: [phone] Subjective:    I'm seeing this patient by the request  of:  BAITY, REGINA, NP   CC: Neck and thoracic pain  BJY:NWGNFAOZHY Allison [name] is a 39 y.o. female coming in with complaint of neck and thoracic pain. Patient has had this pain for quite some time. Patient was seen by primary care provider one month ago and did have x-rays. Dissection was reviewed by me and patient had some mild cervical arthritis. Patient describes the pain as more of a constant aching pain. Patient is taking ibuprofen as well as a muscle relaxer with some moderate relief. Patient states that the symptoms do interfere with some of her normal daily activities. Patient states that it is starting to affect such as even picking up her kids. Patient states at night and keeps her from falling asleep. Patient states that sometimes she can of tingling in her hands but this is very seldom. No weakness noted.  Past Medical History  Diagnosis Date  . Depression   . Thyroid disease    Past Surgical History  Procedure Laterality Date  . Appendectomy    . Tubal ligation     History  Substance Use Topics  . Smoking status: Former Smoker    Types: Cigarettes    Quit date: 12/05/2007  . Smokeless tobacco: Never Used  . Alcohol Use: Yes     Comment: occ   No Known Allergies Family History  Problem Relation Age of Onset  . Mental illness Mother   . Arthritis Father   . Hypertension Father    No Known Allergies      Past medical history, social, surgical and family history all reviewed in electronic medical record.   Review of Systems: No headache, visual changes, nausea, vomiting, diarrhea, constipation, dizziness, abdominal pain, skin rash, fevers, chills, night sweats, weight loss, swollen lymph nodes, body aches, joint swelling, muscle aches, chest pain, shortness of breath, mood changes.   Objective Blood  pressure 106/80, pulse 78, height  (1.651 m), weight 202 lb (91.627 kg), last menstrual period 05/06/2015, SpO2 99 %.  General: No apparent distress alert and oriented x3 mood and affect normal, dressed appropriately.  HEENT: Pupils equal, extraocular movements intact  Respiratory: Patient's speak in full sentences and does not appear short of breath  Cardiovascular: No lower extremity edema, non tender, no erythema  Skin: Warm dry intact with no signs of infection or rash on extremities or on axial skeleton.  Abdomen: Soft nontender  Neuro: Cranial nerves II through XII are intact, neurovascularly intact in all extremities with 2+ DTRs and 2+ pulses.  Lymph: No lymphadenopathy of posterior or anterior cervical chain or axillae bilaterally.  Gait normal with good balance and coordination.  MSK:  Non tender with full range of motion and good stability and symmetric strength and tone of shoulders, elbows, wrist, hip, knee and ankles bilaterally.  Neck: Inspection unremarkable. No palpable stepoffs. Negative Spurling's maneuver. Full neck range of motion Grip strength and sensation normal in bilateral hands Strength good C4 to T1 distribution No sensory change to C4 to T1 Negative Hoffman sign bilaterally Reflexes normal Severe dysfunction with flaring of the right scapula with range of motion  Osteopathic findings C2 flexed rotated and side bent left C4 flexed rotated and side bent right  T3 extended rotated and side bent right inhaled third rib T7 extended rotated and side bent right  Procedure  note 97110; 15 minutes spent for Therapeutic exercises as stated in above notes.  This included exercises focusing on stretching, strengthening, with significant focus on eccentric aspects. Shoulder Exercises that included:  Basic scapular stabilization to include adduction and depression of scapula Scaption, focusing on proper movement and good control Internal and External rotation  utilizing a theraband, with elbow tucked at side entire time Rows with theraband  Proper technique shown and discussed handout in great detail with ATC.  All questions were discussed and answered.     Impression and Recommendations:     This case required medical decision making of moderate complexity.

## 2015-06-17 NOTE — Assessment & Plan Note (Signed)
Patient has neck pain but it think is more secondary to muscle imbalances. Patient has full range of motion. X-rays do show mild arthritis that do not think that this is contribute in. We discussed icing as well as over-the-counter natural supplementation. Patient will try to do more with ergonomics and lifting appropriately. Patient did respond well to osteopathic manipulation. Patient come back and see me again in 3-4 weeks for further evaluation and treatment. Patient has ibuprofen and muscle relaxers for breakthrough pain.

## 2015-06-17 NOTE — Assessment & Plan Note (Signed)
Decision today to treat with OMT was based on Physical Exam  After verbal consent patient was treated with HVLA, ME techniques in cervical, thoracic and rib areas  Patient tolerated the procedure well with improvement in symptoms  Patient given exercises, stretches and lifestyle modifications  See medications in patient instructions if given  Patient will follow up in 3 weeks  

## 2015-06-29 ENCOUNTER — Other Ambulatory Visit: Payer: Self-pay | Admitting: Internal Medicine

## 2015-07-08 ENCOUNTER — Encounter: Payer: Self-pay | Admitting: Family Medicine

## 2015-07-08 ENCOUNTER — Ambulatory Visit (INDEPENDENT_AMBULATORY_CARE_PROVIDER_SITE_OTHER): Payer: BLUE CROSS/BLUE SHIELD | Admitting: Family Medicine

## 2015-07-08 VITALS — BP 112/76 | HR 74 | Ht 65.0 in | Wt 204.0 lb

## 2015-07-08 DIAGNOSIS — M9901 Segmental and somatic dysfunction of cervical region: Secondary | ICD-10-CM

## 2015-07-08 DIAGNOSIS — M999 Biomechanical lesion, unspecified: Secondary | ICD-10-CM

## 2015-07-08 DIAGNOSIS — M899 Disorder of bone, unspecified: Secondary | ICD-10-CM

## 2015-07-08 NOTE — Progress Notes (Signed)
Pre visit review using our clinic review tool, if applicable. No additional management support is needed unless otherwise documented below in the visit note. 

## 2015-07-08 NOTE — Assessment & Plan Note (Signed)
Patient overall has been mild improvement. Subjectively though patient is feeling better. We encourage her to continue do the exercises. We discussed cross training as well as different diet changes that could be beneficial. Patient will continue with the natural supplementations. Patient come back and see me again in 3-4 weeks for further evaluation and treatment. Patient does have muscle relaxer for breakthrough pain and ibuprofen if necessary.

## 2015-07-08 NOTE — Assessment & Plan Note (Signed)
Decision today to treat with OMT was based on Physical Exam  After verbal consent patient was treated with HVLA, ME techniques in cervical, thoracic and rib areas  Patient tolerated the procedure well with improvement in symptoms  Patient given exercises, stretches and lifestyle modifications  See medications in patient instructions if given  Patient will follow up in 3-4 weeks      

## 2015-07-08 NOTE — Patient Instructions (Addendum)
Good to see you Ice is your friend when needed Continue to work on posture and ergonomics at work or when sitting a long amount of time.  2 tennis ball in tube sock and lay on it where head meets neck.  Continue the vitamins Omega 3 better than omega 6 See me again 3-4 weeks

## 2015-07-08 NOTE — Progress Notes (Signed)
  [name] [address] [place], [place] [address] Phone: [phone] Subjective:    CC: Neck and thoracic pain follow up  BJY:NWGNFAOZHY Allison [name] is a 39 y.o. female coming in with complaint of neck and thoracic pain. Patient has had this pain for quite some time. Known mild osteophytic changes of the cervical spine. Patient was seen by me and did respond fairly well to osteopathic manipulation as well as given home exercises working on scapular strengthening and hurt in scapular dysfunction. Patient did have muscle relaxer for breakthrough pain. Patient states overall she has made some improvement. Patient states that the discomfort is significantly less. Patient tries remember to the exercises but has had difficulty with taking care of her children during the summer. Patient denies any new symptoms just continued tightness of the upper back and neck.  Past Medical History  Diagnosis Date  . Depression   . Thyroid disease    Past Surgical History  Procedure Laterality Date  . Appendectomy    . Tubal ligation     History  Substance Use Topics  . Smoking status: Former Smoker    Types: Cigarettes    Quit date: 12/05/2007  . Smokeless tobacco: Never Used  . Alcohol Use: Yes     Comment: occ   No Known Allergies Family History  Problem Relation Age of Onset  . Mental illness Mother   . Arthritis Father   . Hypertension Father    No Known Allergies      Past medical history, social, surgical and family history all reviewed in electronic medical record.   Review of Systems: No headache, visual changes, nausea, vomiting, diarrhea, constipation, dizziness, abdominal pain, skin rash, fevers, chills, night sweats, weight loss, swollen lymph nodes, body aches, joint swelling, muscle aches, chest pain, shortness of breath, mood changes.   Objective Blood pressure 112/76, pulse 74, height  (1.651 m), weight 204 lb (92.534 kg), SpO2 99 %.    General: No apparent distress alert and oriented x3 mood and affect normal, dressed appropriately.  HEENT: Pupils equal, extraocular movements intact  Respiratory: Patient's speak in full sentences and does not appear short of breath  Cardiovascular: No lower extremity edema, non tender, no erythema  Skin: Warm dry intact with no signs of infection or rash on extremities or on axial skeleton.  Abdomen: Soft nontender  Neuro: Cranial nerves II through XII are intact, neurovascularly intact in all extremities with 2+ DTRs and 2+ pulses.  Lymph: No lymphadenopathy of posterior or anterior cervical chain or axillae bilaterally.  Gait normal with good balance and coordination.  MSK:  Non tender with full range of motion and good stability and symmetric strength and tone of shoulders, elbows, wrist, hip, knee and ankles bilaterally.  Neck: Inspection unremarkable. No palpable stepoffs. Negative Spurling's maneuver. Full neck range of motion Grip strength and sensation normal in bilateral hands Strength good C4 to T1 distribution No sensory change to C4 to T1 Negative Hoffman sign bilaterally Reflexes normal Continued scapular dysfunction noted  Osteopathic findings C2 flexed rotated and side bent left C4 flexed rotated and side bent right  T3 extended rotated and side bent right inhaled third rib T5 extended rotated and side bent right T8 extended rotated and side bent right      Impression and Recommendations:     This case required medical decision making of moderate complexity.

## 2015-07-29 ENCOUNTER — Ambulatory Visit (INDEPENDENT_AMBULATORY_CARE_PROVIDER_SITE_OTHER): Payer: BLUE CROSS/BLUE SHIELD | Admitting: Family Medicine

## 2015-07-29 ENCOUNTER — Encounter: Payer: Self-pay | Admitting: Family Medicine

## 2015-07-29 VITALS — BP 122/80 | HR 72 | Wt 202.0 lb

## 2015-07-29 DIAGNOSIS — M9901 Segmental and somatic dysfunction of cervical region: Secondary | ICD-10-CM | POA: Diagnosis not present

## 2015-07-29 DIAGNOSIS — M9908 Segmental and somatic dysfunction of rib cage: Secondary | ICD-10-CM

## 2015-07-29 DIAGNOSIS — M9902 Segmental and somatic dysfunction of thoracic region: Secondary | ICD-10-CM | POA: Diagnosis not present

## 2015-07-29 DIAGNOSIS — M899 Disorder of bone, unspecified: Secondary | ICD-10-CM | POA: Diagnosis not present

## 2015-07-29 DIAGNOSIS — M999 Biomechanical lesion, unspecified: Secondary | ICD-10-CM

## 2015-07-29 NOTE — Patient Instructions (Addendum)
Perfect timing.  You are doing amazing and ahead of schedule We still have room to grow.  Get back to the gym Ice when you need Continue the vitamins See me again in 4 weeks.

## 2015-07-29 NOTE — Assessment & Plan Note (Signed)
Decision today to treat with OMT was based on Physical Exam  After verbal consent patient was treated with HVLA, ME techniques in cervical, thoracic and rib areas  Patient tolerated the procedure well with improvement in symptoms  Patient given exercises, stretches and lifestyle modifications  See medications in patient instructions if given  Patient will follow up in 4 weeks    

## 2015-07-29 NOTE — Progress Notes (Signed)
  [name] [address] [place], [place] [address] Phone: [phone] Subjective:    CC: Neck and thoracic pain follow up  NUU:[id number] Allison [name] is a 39 y.o. female coming in with complaint of neck and thoracic pain. Patient has had this pain for quite some time. Known mild osteophytic changes of the cervical spine. Patient has been doing very well with the scapular exercises. Patient states that she has noticed that the frequency and severity has decreased over the course of time. Patient is had difficulty taking care of herself though because she has had 6 kids recently. Patient denies anything that is stopping her from any activities and she is sleeping comfortably at night. No side effects to the vitamins.  Patient did get back from a road trip that seem to exacerbate the pain somewhat.  Past Medical History  Diagnosis Date  . Depression   . Thyroid disease    Past Surgical History  Procedure Laterality Date  . Appendectomy    . Tubal ligation     Social History  Substance Use Topics  . Smoking status: Former Smoker    Types: Cigarettes    Quit date: 12/05/2007  . Smokeless tobacco: Never Used  . Alcohol Use: Yes     Comment: occ   No Known Allergies Family History  Problem Relation Age of Onset  . Mental illness Mother   . Arthritis Father   . Hypertension Father    No Known Allergies      Past medical history, social, surgical and family history all reviewed in electronic medical record.   Review of Systems: No headache, visual changes, nausea, vomiting, diarrhea, constipation, dizziness, abdominal pain, skin rash, fevers, chills, night sweats, weight loss, swollen lymph nodes, body aches, joint swelling, muscle aches, chest pain, shortness of breath, mood changes.   Objective Blood pressure 122/80, pulse 72, weight 202 lb (91.627 kg).  General: No apparent distress alert and oriented x3 mood and affect normal, dressed  appropriately.  HEENT: Pupils equal, extraocular movements intact  Respiratory: Patient's speak in full sentences and does not appear short of breath  Cardiovascular: No lower extremity edema, non tender, no erythema  Skin: Warm dry intact with no signs of infection or rash on extremities or on axial skeleton.  Abdomen: Soft nontender  Neuro: Cranial nerves II through XII are intact, neurovascularly intact in all extremities with 2+ DTRs and 2+ pulses.  Lymph: No lymphadenopathy of posterior or anterior cervical chain or axillae bilaterally.  Gait normal with good balance and coordination.  MSK:  Non tender with full range of motion and good stability and symmetric strength and tone of shoulders, elbows, wrist, hip, knee and ankles bilaterally.  Neck: Inspection unremarkable. No palpable stepoffs. Negative Spurling's maneuver. Full neck range of motion Grip strength and sensation normal in bilateral hands Strength good C4 to T1 distribution No sensory change to C4 to T1 Negative Hoffman sign bilaterally Reflexes normal Continued scapular dysfunction noted with minimal improvement  Osteopathic findings C2 flexed rotated and side bent left C4 flexed rotated and side bent right C7 flexed rotated and side bent left T3 extended rotated and side bent right inhaled third rib T5 extended rotated and side bent right T8 extended rotated and side bent right      Impression and Recommendations:     This case required medical decision making of moderate complexity.

## 2015-08-06 ENCOUNTER — Ambulatory Visit (INDEPENDENT_AMBULATORY_CARE_PROVIDER_SITE_OTHER): Payer: BLUE CROSS/BLUE SHIELD | Admitting: Family Medicine

## 2015-08-06 ENCOUNTER — Telehealth: Payer: Self-pay | Admitting: *Deleted

## 2015-08-06 ENCOUNTER — Encounter: Payer: Self-pay | Admitting: Family Medicine

## 2015-08-06 ENCOUNTER — Telehealth: Payer: Self-pay | Admitting: Internal Medicine

## 2015-08-06 VITALS — BP 102/78 | HR 93 | Temp 98.1°F | Wt 201.0 lb

## 2015-08-06 DIAGNOSIS — F4322 Adjustment disorder with anxiety: Secondary | ICD-10-CM | POA: Diagnosis not present

## 2015-08-06 MED ORDER — SERTRALINE HCL 50 MG PO TABS: 50.0000 mg | ORAL_TABLET | Freq: Every day | ORAL | Status: DC

## 2015-08-06 NOTE — Progress Notes (Signed)
Subjective:    Patient ID: Allison [name], female    DOB: [birth year], 39 y.o.   MRN: [id number]  HPI Here for panic disorder   A typical panic attack : Feels like she suddenly needs to cry/ easily tearful  Nervous /in a panicked state -very upset  Then has a hard time catching her breath  Keeps good insight and knows she is not dying    Also irritability , yelling more  Not eating well Not sleeping well  Some tearfulness   Kids have had hand foot and mouth   Stressors  Child started kindergarten -rough start so far   Has hx of anxiety and depression in the past  College -depression  Then anxiety/panic after Then post partum- a little different   Was prev on sertraline and wellbutrin and klonopin  Has been out of wellbutrin  And just not taking the other meds - forgets to take it and then does not sleep the first few days so she puts it off   Does not think she needs the wellbutrin however   Only took klonopin twice - did not work well    Patient Active Problem List   Diagnosis Date Noted  . Neck pain, bilateral posterior 06/17/2015  . Nonallopathic lesion of cervical region 06/17/2015  . Nonallopathic lesion of thoracic region 06/17/2015  . Nonallopathic lesion-rib cage 06/17/2015  . Scapular dysfunction 06/17/2015  . Adjustment disorder with anxious mood 11/13/2014   Past Medical History  Diagnosis Date  . Depression   . Thyroid disease    Past Surgical History  Procedure Laterality Date  . Appendectomy    . Tubal ligation     Social History  Substance Use Topics  . Smoking status: Former Smoker    Types: Cigarettes    Quit date: 12/05/2007  . Smokeless tobacco: Never Used  . Alcohol Use: Yes     Comment: occ   Family History  Problem Relation Age of Onset  . Mental illness Mother   . Arthritis Father   . Hypertension Father    No Known Allergies Current Outpatient Prescriptions on File Prior to Visit  Medication Sig Dispense Refill    . buPROPion (WELLBUTRIN XL) 150 MG 24 hr tablet TAKE 1 TABLET (150 MG TOTAL) BY MOUTH DAILY. 30 tablet 3  . clonazePAM (KLONOPIN) 0.5 MG tablet Take 1 tablet (0.5 mg total) by mouth 3 (three) times daily as needed for anxiety. 90 tablet 3  . ibuprofen (ADVIL,MOTRIN) 600 MG tablet Take 1 tablet (600 mg total) by mouth every 8 (eight) hours as needed. 30 tablet 0  . methocarbamol (ROBAXIN) 500 MG tablet Take 1 tablet (500 mg total) by mouth at bedtime as needed for muscle spasms. 30 tablet 0  . sertraline (ZOLOFT) 100 MG tablet Take 50 mg by mouth daily.      No current facility-administered medications on file prior to visit.     Review of Systems    Review of Systems  Constitutional: Negative for fever, appetite change, fatigue and unexpected weight change.  Eyes: Negative for pain and visual disturbance.  Respiratory: Negative for cough and shortness of breath.   Cardiovascular: Negative for cp or palpitations    Gastrointestinal: Negative for nausea, diarrhea and constipation.  Genitourinary: Negative for urgency and frequency.  Skin: Negative for pallor or rash   Neurological: Negative for weakness, light-headedness, numbness and headaches.  Hematological: Negative for adenopathy. Does not bruise/bleed easily.  Psychiatric/Behavioral: pos for dysphoric mood/anxiety and  panic attacks, neg for SI    Objective:   Physical Exam  Constitutional: She appears well-developed and well-nourished. No distress.  overwt and well app  HENT:  Head: Normocephalic and atraumatic.  Mouth/Throat: Oropharynx is clear and moist.  Eyes: Conjunctivae and EOM are normal. Pupils are equal, round, and reactive to light.  Neck: Normal range of motion. Neck supple. Carotid bruit is not present.  Cardiovascular: Normal rate and regular rhythm.   Pulmonary/Chest: Effort normal and breath sounds normal. No respiratory distress. She has no wheezes. She has no rales.  Lymphadenopathy:    She has no cervical  adenopathy.  Neurological: She is alert.  Skin: Skin is warm and dry. No rash noted. No erythema. No pallor.  Psychiatric: Her speech is normal and behavior is normal. Her mood appears anxious. Her affect is not blunt, not labile and not inappropriate. Thought content is not paranoid. Cognition and memory are normal. She exhibits a depressed mood. She expresses no homicidal and no suicidal ideation.  Good insight  Talkative and pleasant  Disc stressors easily  occ almost tearful          Assessment & Plan:   Problem List Items Addressed This Visit      Other   Adjustment disorder with anxious mood - Primary    Symptoms are worse after being off all psychiatric meds for over a month and also increased stressors Reviewed stressors/ coping techniques/symptoms/ support sources/ tx options and side effects in detail today  Chose to re start sertraline 50 mg (this may be most helpful for prev panic attacks)  Good insight  May contemplate counseling in the future  Discussed expectations of SSRI medication including time to effectiveness and mechanism of action, also poss of side effects (early and late)- including mental fuzziness, weight or appetite change, nausea and poss of worse dep or anxiety (even suicidal thoughts)  Pt voiced understanding and will stop med and update if this occurs   Has klonopin for sleep prn  Update if worse or no improvement >25 minutes spent in face to face time with patient, >50% spent in counselling or coordination of care

## 2015-08-06 NOTE — Assessment & Plan Note (Signed)
Symptoms are worse after being off all psychiatric meds for over a month and also increased stressors Reviewed stressors/ coping techniques/symptoms/ support sources/ tx options and side effects in detail today  Chose to re start sertraline 50 mg (this may be most helpful for prev panic attacks)  Good insight  May contemplate counseling in the future  Discussed expectations of SSRI medication including time to effectiveness and mechanism of action, also poss of side effects (early and late)- including mental fuzziness, weight or appetite change, nausea and poss of worse dep or anxiety (even suicidal thoughts)  Pt voiced understanding and will stop med and update if this occurs   Has klonopin for sleep prn  Update if worse or no improvement >25 minutes spent in face to face time with patient, >50% spent in counselling or coordination of care

## 2015-08-06 NOTE — Telephone Encounter (Signed)
Pt states that she has had 2 panic attacks this morning and thinks her meds need to be adjusted, pt was crying on the phone and very upset and said she has had a rough morning. Pt scheduled appt with Dr. Milinda Antis today at 12:15pm but if Nicki Reaper, NP comes in early from the nursing home she would like to be seen this morning. Message sent to Shawna Orleans so if Nicki Reaper opens up her schedule early she will call pt   (please cancel Dr. Royden Purl appt if she can be seen by Rene Kocher this morning)

## 2015-08-06 NOTE — Telephone Encounter (Signed)
Patient called to schedule an appointment with Rene Kocher this morning.  Regina isn't in the office this morning and I offered her an appointment with Jae Dire.  When I asked patient the reason for the visit, she said it was to change her depression medication because she can't sleep. I let her know she would need to see Rene Kocher to change her medication. She said she can't come this afternoon and Regina didn't prescribe the medication for her.  I asked two CMAs, C.S. And D.S. And they said patient should see her PCP. I notified patient and she was crying and said she can't come this afternoon.  I let her know I would send her to [facility] and she could tell them what's going on and they can leave a message for [place].  Patient hung up as I was transferring the call to [facility].  I tried to call patient back,but she didn't answer.

## 2015-08-06 NOTE — Patient Instructions (Signed)
Keep going to the gym  Start the sertraline 50 mg once daily (if worse or intolerable side effects let me know)  If you want a referral to counseling please let me know   If not improving in the next 2-4 weeks please update [place]

## 2015-08-06 NOTE — Telephone Encounter (Signed)
Allison [name] did not come in early

## 2015-08-24 ENCOUNTER — Ambulatory Visit (INDEPENDENT_AMBULATORY_CARE_PROVIDER_SITE_OTHER): Payer: BLUE CROSS/BLUE SHIELD

## 2015-08-24 DIAGNOSIS — Z23 Encounter for immunization: Secondary | ICD-10-CM

## 2015-08-26 ENCOUNTER — Ambulatory Visit (INDEPENDENT_AMBULATORY_CARE_PROVIDER_SITE_OTHER): Payer: BLUE CROSS/BLUE SHIELD | Admitting: Family Medicine

## 2015-08-26 ENCOUNTER — Encounter: Payer: Self-pay | Admitting: Family Medicine

## 2015-08-26 VITALS — BP 108/76 | HR 78 | Ht 65.0 in | Wt 200.0 lb

## 2015-08-26 DIAGNOSIS — M899 Disorder of bone, unspecified: Secondary | ICD-10-CM

## 2015-08-26 DIAGNOSIS — M9908 Segmental and somatic dysfunction of rib cage: Secondary | ICD-10-CM | POA: Diagnosis not present

## 2015-08-26 DIAGNOSIS — M9901 Segmental and somatic dysfunction of cervical region: Secondary | ICD-10-CM | POA: Diagnosis not present

## 2015-08-26 DIAGNOSIS — M999 Biomechanical lesion, unspecified: Secondary | ICD-10-CM

## 2015-08-26 DIAGNOSIS — M9902 Segmental and somatic dysfunction of thoracic region: Secondary | ICD-10-CM

## 2015-08-26 MED ORDER — HYDROXYZINE HCL 25 MG PO TABS: 25.0000 mg | ORAL_TABLET | Freq: Three times a day (TID) | ORAL | Status: DC | PRN

## 2015-08-26 NOTE — Patient Instructions (Addendum)
Good to see you Allison [name] is your friend Start the exercises again in 1 day Hydroxyzine 3 times daily as needed for anxiety Have fun at [employer] and see me right after.

## 2015-08-26 NOTE — Progress Notes (Signed)
  [name] [address] [place], [place] [address] Phone: [phone] Subjective:    CC: Neck and thoracic pain follow up  BJY:NWGNFAOZHY Allison [name] is a 39 y.o. female coming in with complaint of neck and thoracic pain. Patient has had this pain for quite some time. Known mild osteophytic changes of the cervical spine. Patient states that she has had increased stress recently. Patient has had difficulty with her kids going back to school. Patient states that she is having more tightness on the upper trapezius is bilaterally. Nose that this is secondary to more of the anxiety. No radiation down the arms or any numbness or weakness.  Patient did get back from a road trip that seem to exacerbate the pain somewhat.  Past Medical History  Diagnosis Date  . Depression   . Thyroid disease    Past Surgical History  Procedure Laterality Date  . Appendectomy    . Tubal ligation     Social History  Substance Use Topics  . Smoking status: Former Smoker    Types: Cigarettes    Quit date: 12/05/2007  . Smokeless tobacco: Never Used  . Alcohol Use: Yes     Comment: occ   No Known Allergies Family History  Problem Relation Age of Onset  . Mental illness Mother   . Arthritis Father   . Hypertension Father    No Known Allergies      Past medical history, social, surgical and family history all reviewed in electronic medical record.   Review of Systems: No headache, visual changes, nausea, vomiting, diarrhea, constipation, dizziness, abdominal pain, skin rash, fevers, chills, night sweats, weight loss, swollen lymph nodes, body aches, joint swelling, muscle aches, chest pain, shortness of breath, mood changes.   Objective Blood pressure 108/76, pulse 78, height  (1.651 m), weight 200 lb (90.719 kg), last menstrual period 07/08/2015, SpO2 99 %.  General: No apparent distress alert and oriented x3 mood and affect normal, dressed  appropriately.  HEENT: Pupils equal, extraocular movements intact  Respiratory: Patient's speak in full sentences and does not appear short of breath  Cardiovascular: No lower extremity edema, non tender, no erythema  Skin: Warm dry intact with no signs of infection or rash on extremities or on axial skeleton.  Abdomen: Soft nontender  Neuro: Cranial nerves II through XII are intact, neurovascularly intact in all extremities with 2+ DTRs and 2+ pulses.  Lymph: No lymphadenopathy of posterior or anterior cervical chain or axillae bilaterally.  Gait normal with good balance and coordination.  MSK:  Non tender with full range of motion and good stability and symmetric strength and tone of shoulders, elbows, wrist, hip, knee and ankles bilaterally.  Neck: Inspection unremarkable. No palpable stepoffs. Negative Spurling's maneuver. Full neck range of motion passively Grip strength and sensation normal in bilateral hands Strength good C4 to T1 distribution No sensory change to C4 to T1 Negative Hoffman sign bilaterally Reflexes normal Mild improvement with the scapular dysfunction  Osteopathic findings C2 flexed rotated and side bent left C4 flexed rotated and side bent right C7 flexed rotated and side bent left T3 extended rotated and side bent right inhaled third rib T5 extended rotated and side bent right T8 extended rotated and side bent right      Impression and Recommendations:     This case required medical decision making of moderate complexity.

## 2015-08-26 NOTE — Assessment & Plan Note (Signed)
Decision today to treat with OMT was based on Physical Exam  After verbal consent patient was treated with HVLA, ME techniques in cervical, thoracic and rib areas  Patient tolerated the procedure well with improvement in symptoms  Patient given exercises, stretches and lifestyle modifications  See medications in patient instructions if given  Patient will follow up in 4 weeks    

## 2015-08-26 NOTE — Assessment & Plan Note (Signed)
Patient encouraged to take 48 hours off from the exercises and then start again. I do think that some is a underlying anxiety can be contributing and patient was given up her prescription for the hydroxyzine. We discussed icing regimen. Patient will be going out of town and we discussed that I like to see her right after because she will likely need another manipulation. Continue all other conservative therapy.

## 2015-08-26 NOTE — Progress Notes (Signed)
Pre visit review using our clinic review tool, if applicable. No additional management support is needed unless otherwise documented below in the visit note. 

## 2015-09-21 ENCOUNTER — Ambulatory Visit (INDEPENDENT_AMBULATORY_CARE_PROVIDER_SITE_OTHER): Payer: BLUE CROSS/BLUE SHIELD | Admitting: Family Medicine

## 2015-09-21 ENCOUNTER — Encounter: Payer: Self-pay | Admitting: Family Medicine

## 2015-09-21 VITALS — BP 124/82 | HR 69 | Ht 65.0 in | Wt 199.0 lb

## 2015-09-21 DIAGNOSIS — M9902 Segmental and somatic dysfunction of thoracic region: Secondary | ICD-10-CM

## 2015-09-21 DIAGNOSIS — M542 Cervicalgia: Secondary | ICD-10-CM

## 2015-09-21 DIAGNOSIS — M9901 Segmental and somatic dysfunction of cervical region: Secondary | ICD-10-CM | POA: Diagnosis not present

## 2015-09-21 DIAGNOSIS — M9908 Segmental and somatic dysfunction of rib cage: Secondary | ICD-10-CM | POA: Diagnosis not present

## 2015-09-21 DIAGNOSIS — M999 Biomechanical lesion, unspecified: Secondary | ICD-10-CM

## 2015-09-21 NOTE — Patient Instructions (Signed)
Great to see you  Sunscreen! Lets get back on the horse.  Ice is your friend Work on VF Corporationthe posture Keep working on the upper back.  Continue the vitamins See me again in 4 weeks.

## 2015-09-21 NOTE — Progress Notes (Signed)
[name] [address] [place], [place] [address] Phone: [phone] Subjective:    CC: Neck and thoracic pain follow up  BJY:NWGNFAOZHY Allison [name] is a 39 y.o. female coming in with complaint of neck and thoracic pain. Patient has had this pain for quite some time. Known mild osteoarthritic changes of the cervical spine. Patient states that she has had increased stress recently. Patient given some hydroxyzine at last visit and has been on Zoloft for quite some time. Patient was to continue some exercises and has responded well to osteopathic manipulation. Patient was going to [facility].  Patient states she is very sore. Patient was carrying her children around as well as different bags. Feels that this caused some more pain in the neck. Patient also did a lot of driving greater than 17 hours. Patient feels that standing position seem to exacerbate some of her underlying problems. Has not been doing the exercises. More pain that she's been previously.   Past Medical History  Diagnosis Date  . Depression   . Thyroid disease    Past Surgical History  Procedure Laterality Date  . Appendectomy    . Tubal ligation     Social History  Substance Use Topics  . Smoking status: Former Smoker    Types: Cigarettes    Quit date: 12/05/2007  . Smokeless tobacco: Never Used  . Alcohol Use: Yes     Comment: occ   No Known Allergies Family History  Problem Relation Age of Onset  . Mental illness Mother   . Arthritis Father   . Hypertension Father    No Known Allergies      Past medical history, social, surgical and family history all reviewed in electronic medical record.   Review of Systems: No headache, visual changes, nausea, vomiting, diarrhea, constipation, dizziness, abdominal pain, skin rash, fevers, chills, night sweats, weight loss, swollen lymph nodes, body aches, joint swelling, muscle aches, chest pain, shortness of breath, mood  changes.   Objective Blood pressure 124/82, pulse 69, height  (1.651 m), weight 199 lb (90.266 kg), SpO2 99 %.  General: No apparent distress alert and oriented x3 mood and affect normal, dressed appropriately.  HEENT: Pupils equal, extraocular movements intact  Respiratory: Patient's speak in full sentences and does not appear short of breath  Cardiovascular: No lower extremity edema, non tender, no erythema  Skin: Warm dry intact with no signs of infection or rash on extremities or on axial skeleton.  Abdomen: Soft nontender  Neuro: Cranial nerves II through XII are intact, neurovascularly intact in all extremities with 2+ DTRs and 2+ pulses.  Lymph: No lymphadenopathy of posterior or anterior cervical chain or axillae bilaterally.  Gait normal with good balance and coordination.  MSK:  Non tender with full range of motion and good stability and symmetric strength and tone of shoulders, elbows, wrist, hip, knee and ankles bilaterally.  Neck: Inspection unremarkable. No palpable stepoffs. Negative Spurling's maneuver. Full neck range of motion passively patient though is much tighter with the musculature around the neck than previous exam. Grip strength and sensation normal in bilateral hands Strength good C4 to T1 distribution No sensory change to C4 to T1 Negative Hoffman sign bilaterally Reflexes normal   Osteopathic findings C2 flexed rotated and side bent left C4 flexed rotated and side bent right C7 flexed rotated and side bent left T3 extended rotated and side bent right inhaled third rib T5 extended rotated and side bent right T9  extended rotated and side bent right L2 flexed rotated and side bent right      Impression and Recommendations:     This case required medical decision making of moderate complexity.

## 2015-09-21 NOTE — Progress Notes (Signed)
Pre visit review using our clinic review tool, if applicable. No additional management support is needed unless otherwise documented below in the visit note. 

## 2015-09-21 NOTE — Assessment & Plan Note (Signed)
Patient did have significant tightness today. I do think that this is exacerbation of the underlying problem secondary to her not doing the exercises as well as increasing stress. Patient overall though will likely do well when she starts her regimen again. Encourage her to do so on a regular basis. Patient has trouble we may need to consider formal physical therapy. Patient has responded very well to osteopathic manipulation. Patient will come back and see me again in 4 weeks for further evaluation and treatment.

## 2015-09-21 NOTE — Assessment & Plan Note (Signed)
Decision today to treat with OMT was based on Physical Exam  After verbal consent patient was treated with HVLA, ME techniques in cervical, thoracic and rib areas  Patient tolerated the procedure well with improvement in symptoms  Patient given exercises, stretches and lifestyle modifications  See medications in patient instructions if given  Patient will follow up in 4 weeks    

## 2015-09-24 ENCOUNTER — Encounter: Payer: Self-pay | Admitting: Family Medicine

## 2015-10-13 ENCOUNTER — Encounter: Payer: Self-pay | Admitting: Family Medicine

## 2015-10-13 ENCOUNTER — Ambulatory Visit (INDEPENDENT_AMBULATORY_CARE_PROVIDER_SITE_OTHER): Payer: BLUE CROSS/BLUE SHIELD | Admitting: Family Medicine

## 2015-10-13 VITALS — BP 122/74 | HR 84 | Ht 65.0 in | Wt 200.0 lb

## 2015-10-13 DIAGNOSIS — M9902 Segmental and somatic dysfunction of thoracic region: Secondary | ICD-10-CM | POA: Diagnosis not present

## 2015-10-13 DIAGNOSIS — M542 Cervicalgia: Secondary | ICD-10-CM

## 2015-10-13 DIAGNOSIS — M9908 Segmental and somatic dysfunction of rib cage: Secondary | ICD-10-CM | POA: Diagnosis not present

## 2015-10-13 DIAGNOSIS — M9901 Segmental and somatic dysfunction of cervical region: Secondary | ICD-10-CM | POA: Diagnosis not present

## 2015-10-13 DIAGNOSIS — M999 Biomechanical lesion, unspecified: Secondary | ICD-10-CM

## 2015-10-13 NOTE — Progress Notes (Signed)
Allison ScaleZach [name] D.O. [facility] [address] [place], [place] [address] Phone: [phone] Subjective:    CC: Neck and thoracic pain follow up  BJY:NWGNFAOZHYHPI:Subjective Allison JacobsonJulie Annette [name] is a 39 y.o. female coming in with complaint of neck and thoracic pain. Patient has had this pain for quite some time. Known mild osteoarthritic changes of the cervical spine. Patient is remodeling fairly active wrist limb. Has had some increasing anxiety which is deathly cause more stress in her neck and upper back. Patient is also not doing the exercises on a regular basis. Patient has difficulty even taking the medicine regularly at this time. Patient knows that she needs to try harder she states. Denies any significant new symptoms just worsening of previous symptoms.   Past Medical History  Diagnosis Date  . Depression   . Thyroid disease    Past Surgical History  Procedure Laterality Date  . Appendectomy    . Tubal ligation     Social History  Substance Use Topics  . Smoking status: Former Smoker    Types: Cigarettes    Quit date: 12/05/2007  . Smokeless tobacco: Never Used  . Alcohol Use: Yes     Comment: occ   No Known Allergies Family History  Problem Relation Age of Onset  . Mental illness Mother   . Arthritis Father   . Hypertension Father    No Known Allergies      Past medical history, social, surgical and family history all reviewed in electronic medical record.   Review of Systems: No headache, visual changes, nausea, vomiting, diarrhea, constipation, dizziness, abdominal pain, skin rash, fevers, chills, night sweats, weight loss, swollen lymph nodes, body aches, joint swelling, muscle aches, chest pain, shortness of breath, mood changes.   Objective Blood pressure 122/74, pulse 84, height 5\' 5"  (1.651 m), weight 200 lb (90.719 kg), SpO2 98 %.  General: No apparent distress alert and oriented x3 mood and affect normal, dressed appropriately.  HEENT: Pupils equal,  extraocular movements intact  Respiratory: Patient's speak in full sentences and does not appear short of breath  Cardiovascular: No lower extremity edema, non tender, no erythema  Skin: Warm dry intact with no signs of infection or rash on extremities or on axial skeleton.  Abdomen: Soft nontender  Neuro: Cranial nerves II through XII are intact, neurovascularly intact in all extremities with 2+ DTRs and 2+ pulses.  Lymph: No lymphadenopathy of posterior or anterior cervical chain or axillae bilaterally.  Gait normal with good balance and coordination.  MSK:  Non tender with full range of motion and good stability and symmetric strength and tone of shoulders, elbows, wrist, hip, knee and ankles bilaterally.  Neck: Inspection unremarkable. No palpable stepoffs. Negative Spurling's maneuver. Full neck range of motion the patient does have increasing tension of the pairs spinal musculature bilaterally Grip strength and sensation normal in bilateral hands Strength good C4 to T1 distribution No sensory change to C4 to T1 Negative Hoffman sign bilaterally Reflexes normal Scapular dysfunction still noted.   Osteopathic findings C2 flexed rotated and side bent left C4 flexed rotated and side bent right C7 flexed rotated and side bent left T3 extended rotated and side bent right inhaled third rib trigger points within this area as well as. T5 extended rotated and side bent right T9 extended rotated and side bent right L2 flexed rotated and side bent right      Impression and Recommendations:     This case required medical decision making of  moderate complexity.

## 2015-10-13 NOTE — Patient Instructions (Signed)
Good to see you COntinue what you are doing See me when you can between 3-6 weeks.

## 2015-10-13 NOTE — Assessment & Plan Note (Signed)
Decision today to treat with OMT was based on Physical Exam  After verbal consent patient was treated with HVLA, ME techniques in cervical, thoracic and rib areas  Patient tolerated the procedure well with improvement in symptoms  Patient given exercises, stretches and lifestyle modifications  See medications in patient instructions if given  Patient will follow up in 4-6 weeks                                           

## 2015-10-13 NOTE — Assessment & Plan Note (Signed)
Patient does have stiffness. Encourage patient to do more ergonomics. We discussed that and likely some of this is sacral somatic as well. We discussed which activities to do an which was potentially avoid. Patient will work on posture more often. Patient will come back and see me again in 4-6 weeks.

## 2015-10-13 NOTE — Progress Notes (Signed)
Pre visit review using our clinic review tool, if applicable. No additional management support is needed unless otherwise documented below in the visit note. 

## 2015-11-16 ENCOUNTER — Ambulatory Visit (INDEPENDENT_AMBULATORY_CARE_PROVIDER_SITE_OTHER): Payer: BLUE CROSS/BLUE SHIELD | Admitting: Internal Medicine

## 2015-11-16 ENCOUNTER — Encounter: Payer: Self-pay | Admitting: Internal Medicine

## 2015-11-16 VITALS — BP 116/80 | HR 77 | Temp 98.1°F | Wt 208.0 lb

## 2015-11-16 DIAGNOSIS — R1011 Right upper quadrant pain: Secondary | ICD-10-CM

## 2015-11-16 DIAGNOSIS — F4322 Adjustment disorder with anxiety: Secondary | ICD-10-CM | POA: Diagnosis not present

## 2015-11-16 DIAGNOSIS — R110 Nausea: Secondary | ICD-10-CM | POA: Diagnosis not present

## 2015-11-16 DIAGNOSIS — R1013 Epigastric pain: Secondary | ICD-10-CM | POA: Diagnosis not present

## 2015-11-16 LAB — COMPREHENSIVE METABOLIC PANEL
ALK PHOS: 94 U/L (ref 39–117)
ALT: 12 U/L (ref 0–35)
AST: 16 U/L (ref 0–37)
Albumin: 4.1 g/dL (ref 3.5–5.2)
BILIRUBIN TOTAL: 0.3 mg/dL (ref 0.2–1.2)
BUN: 9 mg/dL (ref 6–23)
CO2: 30 meq/L (ref 19–32)
CREATININE: 0.8 mg/dL (ref 0.40–1.20)
Calcium: 9.4 mg/dL (ref 8.4–10.5)
Chloride: 103 mEq/L (ref 96–112)
GFR: 85.33 mL/min (ref >60.00)
GLUCOSE: 124 mg/dL — AB (ref 70–99)
Potassium: 3.7 mEq/L (ref 3.5–5.1)
Sodium: 139 mEq/L (ref 135–145)
TOTAL PROTEIN: 7.2 g/dL (ref 6.0–8.3)

## 2015-11-16 LAB — LIPASE: Lipase: 15 U/L (ref 11.0–59.0)

## 2015-11-16 MED ORDER — CLONAZEPAM 0.5 MG PO TABS: 0.5000 mg | ORAL_TABLET | Freq: Three times a day (TID) | ORAL | Status: DC | PRN

## 2015-11-16 NOTE — Assessment & Plan Note (Signed)
Klonopin refilled today  

## 2015-11-16 NOTE — Progress Notes (Signed)
Pre visit review using our clinic review tool, if applicable. No additional management support is needed unless otherwise documented below in the visit note. 

## 2015-11-16 NOTE — Patient Instructions (Signed)
Cholecystitis Cholecystitis is inflammation of the gallbladder. It is often called a gallbladder attack. The gallbladder is a pear-shaped organ that lies beneath the liver on the right side of the body. The gallbladder stores bile, which is a fluid that helps the body to digest fats. If bile builds up in your gallbladder, your gallbladder becomes inflamed. This condition may occur suddenly (be acute). Repeat episodes of acute cholecystitis or prolonged episodes may lead to a long-term (chronic) condition. Cholecystitis is serious and it requires treatment.  CAUSES The most common cause of this condition is gallstones. Gallstones can block the tube (duct) that carries bile out of your gallbladder. This causes bile to build up. Other causes of this condition include:  Damage to the gallbladder due to a decrease in blood flow.  Infections in the bile ducts.  Scars or kinks in the bile ducts.  Tumors in the liver, pancreas, or gallbladder. RISK FACTORS This condition is more likely to develop in:  People who have sickle cell disease.  People who take birth control pills or use estrogen.  People who have alcoholic liver disease.  People who have liver cirrhosis.  People who have their nutrition delivered through a vein (parenteral nutrition).  People who do not eat or drink (do fasting) for a long period of time.  People who are obese.  People who have rapid weight loss.  People who are pregnant.  People who have increased triglyceride levels.  People who have pancreatitis. SYMPTOMS Symptoms of this condition include:  Abdominal pain, especially in the upper right area of the abdomen.  Abdominal tenderness or bloating.  Nausea.  Vomiting.  Fever.  Chills.  Yellowing of the skin and the whites of the eyes (jaundice). DIAGNOSIS This condition is diagnosed with a medical history and physical exam. You may also have other tests, including:  Imaging tests, such as:  An  ultrasound of the gallbladder.  A CT scan of the abdomen.  A gallbladder nuclear scan (HIDA scan). This scan allows your health care provider to see the bile moving from your liver to your gallbladder and to your small intestine.  MRI.  Blood tests, such as:  A complete blood count, because the white blood cell count may be higher than normal.  Liver function tests, because some levels may be higher than normal with certain types of gallstones. TREATMENT Treatment may include:  Fasting for a certain amount of time.  IV fluids.  Medicine to treat pain or vomiting.  Antibiotic medicine.  Surgery to remove your gallbladder (cholecystectomy). This may happen immediately or at a later time. HOME CARE INSTRUCTIONS Home care will depend on your treatment. In general:  Take over-the-counter and prescription medicines only as told by your health care provider.  If you were prescribed an antibiotic medicine, take it as told by your health care provider. Do not stop taking the antibiotic even if you start to feel better.  Follow instructions from your health care provider about what to eat or drink. When you are allowed to eat, avoid eating or drinking anything that triggers your symptoms.  Keep all follow-up visits as told by your health care provider. This is important. SEEK MEDICAL CARE IF:  Your pain is not controlled with medicine.  You have a fever. SEEK IMMEDIATE MEDICAL CARE IF:  Your pain moves to another part of your abdomen or to your back.  You continue to have symptoms or you develop new symptoms even with treatment.   This information   is not intended to replace advice given to you by your health care provider. Make sure you discuss any questions you have with your health care provider.   Document Released: 11/20/2005 Document Revised: 08/11/2015 Document Reviewed: 03/03/2015 Elsevier Interactive Patient Education 2016 [employer]  

## 2015-11-16 NOTE — Progress Notes (Signed)
Subjective:    Patient ID: Allison [name], female    DOB: 1976/06/08, 39 y.o.   MRN: [id number]  HPI  Pt presents to the clinic today with c/o abdominal pain and nausea. This has been intermittent over the last month. The pain is in her epigastric area and radiates behind her right shoulder blade. She describes the pain as sharp and stabbing. It seems worse when she drinks alcohol. She has had nausea but denies vomiting. Her bowels are moving normally. She denies urinary or vaginal complaints. She has not tried anything OTC. She reports she has had reflux in the past and this feels different.  She also requests a refill of her Klonopin today. She only takes it as needed for her anxiety. She reports it works well.  Review of Systems      Past Medical History  Diagnosis Date  . Depression   . Thyroid disease     Current Outpatient Prescriptions  Medication Sig Dispense Refill  . clonazePAM (KLONOPIN) 0.5 MG tablet Take 1 tablet (0.5 mg total) by mouth 3 (three) times daily as needed for anxiety. 90 tablet 3  . methocarbamol (ROBAXIN) 500 MG tablet Take 1 tablet (500 mg total) by mouth at bedtime as needed for muscle spasms. 30 tablet 0  . sertraline (ZOLOFT) 50 MG tablet Take 1 tablet (50 mg total) by mouth daily. 30 tablet 11   No current facility-administered medications for this visit.    No Known Allergies  Family History  Problem Relation Age of Onset  . Mental illness Mother   . Arthritis Father   . Hypertension Father     Social History   Social History  . Marital Status: Married    Spouse Name: N/A  . Number of Children: N/A  . Years of Education: N/A   Occupational History  . Not on file.   Social History Main Topics  . Smoking status: Former Smoker    Types: Cigarettes    Quit date: 12/05/2007  . Smokeless tobacco: Never Used  . Alcohol Use: Yes     Comment: occ  . Drug Use: No  . Sexual Activity: Yes    Birth Control/ Protection: Surgical    Other Topics Concern  . Not on file   Social History Narrative     Constitutional: Denies fever, malaise, fatigue, headache or abrupt weight changes.  Respiratory: Denies difficulty breathing, shortness of breath, cough or sputum production.   Cardiovascular: Denies chest pain, chest tightness, palpitations or swelling in the hands or feet.  Gastrointestinal: Pt reports abdominal pain and nausea. Denies bloating, constipation, diarrhea or blood in the stool.  GU: Denies urgency, frequency, pain with urination, burning sensation, blood in urine, odor or discharge. Psych: Pt reports anxiety. Denies depression, SI/HI.  No other specific complaints in a complete review of systems (except as listed in HPI above).  Objective:   Physical Exam  BP 116/80 mmHg  Pulse 77  Temp(Src) 98.1 F (36.7 C) (Oral)  Wt 208 lb (94.348 kg)  SpO2 99%  LMP 11/04/2015  Wt Readings from Last 3 Encounters:  11/16/15 208 lb (94.348 kg)  10/13/15 200 lb (90.719 kg)  09/21/15 199 lb (90.266 kg)    General: Appears her stated age, well developed, well nourished in NAD. Cardiovascular: Normal rate and rhythm. S1,S2 noted.  No murmur, rubs or gallops noted.  Pulmonary/Chest: Normal effort and positive vesicular breath sounds. No respiratory distress. No wheezes, rales or ronchi noted.  Abdomen: Soft and  tender in the epigastric area. Normal bowel sounds. No distention or masses noted. Liver, spleen and kidneys non palpable. Negative Murphy's. Neurological: Alert and oriented.  Psych: Mood and affect normal.  BMET    Component Value Date/Time   NA 137 12/17/2014 1418   K 3.9 12/17/2014 1418   CL 102 12/17/2014 1418   CO2 30 12/17/2014 1418   GLUCOSE 90 12/17/2014 1418   BUN 12 12/17/2014 1418   CREATININE 0.84 12/17/2014 1418   CALCIUM 9.7 12/17/2014 1418    Lipid Panel     Component Value Date/Time   CHOL 176 10/23/2013 1003   TRIG 198.0* 10/23/2013 1003   HDL 39.90 10/23/2013 1003    CHOLHDL 4 10/23/2013 1003   VLDL 39.6 10/23/2013 1003   LDLCALC 97 10/23/2013 1003    CBC    Component Value Date/Time   WBC 9.1 12/17/2014 1418   RBC 4.96 12/17/2014 1418   HGB 12.9 12/17/2014 1418   HCT 39.5 12/17/2014 1418   PLT 338.0 12/17/2014 1418   MCV 79.6 12/17/2014 1418   MCHC 32.6 12/17/2014 1418   RDW 15.2 12/17/2014 1418    Hgb A1C No results found for: HGBA1C       Assessment & Plan:   Epigastric pain, RUQ pain and nausea:  Will check CMET and Lipase today Try to avoid alcohol Go ahead and start Prilosec 20 mg daily, 30 minutes before breakfast  Will follow up after labs, RTC as needed

## 2015-11-17 ENCOUNTER — Ambulatory Visit (INDEPENDENT_AMBULATORY_CARE_PROVIDER_SITE_OTHER): Payer: BLUE CROSS/BLUE SHIELD | Admitting: Family Medicine

## 2015-11-17 ENCOUNTER — Encounter: Payer: Self-pay | Admitting: Family Medicine

## 2015-11-17 VITALS — BP 110/74 | HR 71 | Ht 65.0 in | Wt 202.0 lb

## 2015-11-17 DIAGNOSIS — M9902 Segmental and somatic dysfunction of thoracic region: Secondary | ICD-10-CM

## 2015-11-17 DIAGNOSIS — M9901 Segmental and somatic dysfunction of cervical region: Secondary | ICD-10-CM | POA: Diagnosis not present

## 2015-11-17 DIAGNOSIS — M542 Cervicalgia: Secondary | ICD-10-CM | POA: Diagnosis not present

## 2015-11-17 DIAGNOSIS — M9908 Segmental and somatic dysfunction of rib cage: Secondary | ICD-10-CM | POA: Diagnosis not present

## 2015-11-17 DIAGNOSIS — M999 Biomechanical lesion, unspecified: Secondary | ICD-10-CM

## 2015-11-17 NOTE — Progress Notes (Signed)
  Tawana ScaleZach Dianelys Scinto D.O. [facility] [address] [place], [place] [address] Phone: [phone] Subjective:    CC: Neck and thoracic pain follow up  BJY:NWGNFAOZHYHPI:Subjective Allison JacobsonJulie Annette [name] is a 39 y.o. female coming in with complaint of neck and thoracic pain. Patient has had this pain for quite some time. Known mild osteoarthritic changes of the cervical spine. Patient continues to have stiffness overall. Patient denies any radiation into her arms. States that her lower back seems to be significant titers well. Has been noncompliant on the exercises secondary to many different family stressors.    Past Medical History  Diagnosis Date  . Depression   . Thyroid disease    Past Surgical History  Procedure Laterality Date  . Appendectomy    . Tubal ligation     Social History  Substance Use Topics  . Smoking status: Former Smoker    Types: Cigarettes    Quit date: 12/05/2007  . Smokeless tobacco: Never Used  . Alcohol Use: Yes     Comment: occ   No Known Allergies Family History  Problem Relation Age of Onset  . Mental illness Mother   . Arthritis Father   . Hypertension Father    No Known Allergies      Past medical history, social, surgical and family history all reviewed in electronic medical record.   Review of Systems: No headache, visual changes, nausea, vomiting, diarrhea, constipation, dizziness, abdominal pain, skin rash, fevers, chills, night sweats, weight loss, swollen lymph nodes, body aches, joint swelling, muscle aches, chest pain, shortness of breath, mood changes.   Objective Blood pressure 110/74, pulse 71, height 5\' 5"  (1.651 m), weight 202 lb (91.627 kg), last menstrual period 11/04/2015, SpO2 98 %.  General: No apparent distress alert and oriented x3 mood and affect normal, dressed appropriately.  HEENT: Pupils equal, extraocular movements intact  Respiratory: Patient's speak in full sentences and does not appear short of breath  Cardiovascular:  No lower extremity edema, non tender, no erythema  Skin: Warm dry intact with no signs of infection or rash on extremities or on axial skeleton.  Abdomen: Soft nontender  Neuro: Cranial nerves II through XII are intact, neurovascularly intact in all extremities with 2+ DTRs and 2+ pulses.  Lymph: No lymphadenopathy of posterior or anterior cervical chain or axillae bilaterally.  Gait normal with good balance and coordination.  MSK:  Non tender with full range of motion and good stability and symmetric strength and tone of shoulders, elbows, wrist, hip, knee and ankles bilaterally.  Neck: Inspection unremarkable. No palpable stepoffs. Negative Spurling's maneuver. Mild increasing tightness but still has full range of motion passively. Grip strength and sensation normal in bilateral hands Strength good C4 to T1 distribution No sensory change to C4 to T1 Negative Hoffman sign bilaterally Reflexes normal Scapular dysfunction still noted. Significant increased tightness of the hip flexors bilaterally right greater than left Negative straight leg test bilaterally. Mild tightness with Pearlean BrownieFaber test on the right side. Neurovascular intact distally.  Osteopathic findings C2 flexed rotated and side bent left C4 flexed rotated and side bent right  T3 extended rotated and side bent right inhaled third rib T5 extended rotated and side bent right T7 extended rotated and side bent right L2 flexed rotated and side bent right      Impression and Recommendations:     This case required medical decision making of moderate complexity.

## 2015-11-17 NOTE — Progress Notes (Signed)
Pre visit review using our clinic review tool, if applicable. No additional management support is needed unless otherwise documented below in the visit note. 

## 2015-11-17 NOTE — Assessment & Plan Note (Signed)
Patient does have more of some tightness. I do think it is somewhat psychosomatic. 90 think the patient's anxiety does cause some increasing muscle tone. We discussed with patient about potentially doing some medications for this specifically. Patient does not want to make any changes with her sertraline. Patient will continue to maintain active. We discussed though that allowed like her to continue to monitor her weight and work on her ergonomics as well as posture. Patient states that this moment she cannot find time to take care of herself. Discuss that she will not get better until she does this. Patient will come back again in 4-6 weeks for further evaluation and treatment.  Spent  25 minutes with patient face-to-face and had greater than 50% of counseling including as described above in assessment and plan.

## 2015-11-17 NOTE — Assessment & Plan Note (Signed)
Decision today to treat with OMT was based on Physical Exam  After verbal consent patient was treated with HVLA, ME techniques in cervical, thoracic and rib areas  Patient tolerated the procedure well with improvement in symptoms  Patient given exercises, stretches and lifestyle modifications  See medications in patient instructions if given  Patient will follow up in 4-6 weeks                                           

## 2015-11-17 NOTE — Patient Instructions (Signed)
Good to see you Ice when you need it Get through the month then focus on you Hip flexor is key See me agai nin 4-6 weeks.

## 2015-11-18 ENCOUNTER — Telehealth: Payer: Self-pay | Admitting: Internal Medicine

## 2015-11-18 NOTE — Telephone Encounter (Signed)
Pt called wanting an explanation of labs and what to do next please call [phone] Thank you

## 2015-11-18 NOTE — Telephone Encounter (Signed)
Mel has a result note about this

## 2015-12-15 ENCOUNTER — Ambulatory Visit: Payer: BLUE CROSS/BLUE SHIELD | Admitting: Family Medicine

## 2016-01-05 ENCOUNTER — Ambulatory Visit (INDEPENDENT_AMBULATORY_CARE_PROVIDER_SITE_OTHER): Payer: BLUE CROSS/BLUE SHIELD | Admitting: Family Medicine

## 2016-01-05 ENCOUNTER — Encounter: Payer: Self-pay | Admitting: Family Medicine

## 2016-01-05 VITALS — BP 122/78 | HR 109 | Wt 203.0 lb

## 2016-01-05 DIAGNOSIS — M9902 Segmental and somatic dysfunction of thoracic region: Secondary | ICD-10-CM | POA: Diagnosis not present

## 2016-01-05 DIAGNOSIS — M9908 Segmental and somatic dysfunction of rib cage: Secondary | ICD-10-CM | POA: Diagnosis not present

## 2016-01-05 DIAGNOSIS — M999 Biomechanical lesion, unspecified: Secondary | ICD-10-CM

## 2016-01-05 DIAGNOSIS — M542 Cervicalgia: Secondary | ICD-10-CM

## 2016-01-05 DIAGNOSIS — M9901 Segmental and somatic dysfunction of cervical region: Secondary | ICD-10-CM

## 2016-01-05 MED ORDER — TIZANIDINE HCL 4 MG PO TABS: 4.0000 mg | ORAL_TABLET | Freq: Every evening | ORAL | Status: DC

## 2016-01-05 NOTE — Patient Instructions (Addendum)
Good to see you  Allison [name] is your friend Start dong those exercises or we can send you to PT if we need a little motivation Try to work on posture through the day Zanaflex at night to help with the muscle tightness. See me again in 3-4 weeks. Need to see you do to being a little tighter.

## 2016-01-05 NOTE — Progress Notes (Signed)
  [name] [address] [place], [place] [address] Phone: [phone] Subjective:    CC: Neck and thoracic pain follow up  BJY:NWGNFAOZHY Allison [name] is a 40 y.o. female coming in with complaint of neck and thoracic pain. Patient has had this pain for quite some time. Known mild osteoarthritic changes of the cervical spine. P increasing stiffness noted. Patient has had more difficulty recently. States that she has had increasing stress. Having difficulty sleeping. Not doing the exercises regularly. Notice more discomfort of the lower back as well. No radiation to the arms. No numbness that she had previously as well. Not taking anything regularly for the pain.     Past Medical History  Diagnosis Date  . Depression   . Thyroid disease    Past Surgical History  Procedure Laterality Date  . Appendectomy    . Tubal ligation     Social History  Substance Use Topics  . Smoking status: Former Smoker    Types: Cigarettes    Quit date: 12/05/2007  . Smokeless tobacco: Never Used  . Alcohol Use: Yes     Comment: occ   No Known Allergies Family History  Problem Relation Age of Onset  . Mental illness Mother   . Arthritis Father   . Hypertension Father    No Known Allergies      Past medical history, social, surgical and family history all reviewed in electronic medical record.   Review of Systems: No headache, visual changes, nausea, vomiting, diarrhea, constipation, dizziness, abdominal pain, skin rash, fevers, chills, night sweats, weight loss, swollen lymph nodes, body aches, joint swelling, muscle aches, chest pain, shortness of breath, mood changes.   Objective Blood pressure 122/78, pulse 109, weight 203 lb (92.08 kg), SpO2 97 %.  General: No apparent distress alert and oriented x3 mood and affect normal, dressed appropriately.  HEENT: Pupils equal, extraocular movements intact  Respiratory: Patient's speak in full sentences and  does not appear short of breath  Cardiovascular: No lower extremity edema, non tender, no erythema  Skin: Warm dry intact with no signs of infection or rash on extremities or on axial skeleton.  Abdomen: Soft nontender  Neuro: Cranial nerves II through XII are intact, neurovascularly intact in all extremities with 2+ DTRs and 2+ pulses.  Lymph: No lymphadenopathy of posterior or anterior cervical chain or axillae bilaterally.  Gait normal with good balance and coordination.  MSK:  Non tender with full range of motion and good stability and symmetric strength and tone of shoulders, elbows, wrist, hip, knee and ankles bilaterally.  Neck: Inspection unremarkable. No palpable stepoffs. Negative Spurling's maneuver. Mild increasing tightness but still has full range of motion passively.  Grip strength and sensation normal in bilateral hands Strength good C4 to T1 distribution No sensory change to C4 to T1 Negative Hoffman sign bilaterally Reflexes normal Scapular dysfunction still noted. Continued tightness of the hip flexors bilaterally.  Osteopathic findings C2 flexed rotated and side bent left C4 flexed rotated and side bent right T3 extended rotated and side bent right inhaled third rib  T5 extended rotated and side bent right T7 extended rotated and side bent right L2 flexed rotated and side bent right Sacrum left on left     Impression and Recommendations:     This case required medical decision making of moderate complexity.

## 2016-01-05 NOTE — Assessment & Plan Note (Signed)
Patient does have more of a neck pain that seems to be secondary to stress. We discussed possibly changing her medications which she was not willing to do. Patient didn't also relaxer to help her at night. Avoid any significant overhead activity or lifting. Muscle exercises intermittently throughout the day. I do believe the patient stress is also contribute in. Patient will come back in 3 weeks for further evaluation.

## 2016-01-05 NOTE — Assessment & Plan Note (Signed)
Decision today to treat with OMT was based on Physical Exam  After verbal consent patient was treated with HVLA, ME techniques in cervical, thoracic and rib areas  Patient tolerated the procedure well with improvement in symptoms  Patient given exercises, stretches and lifestyle modifications  See medications in patient instructions if given  Patient will follow up in 3 weeks  

## 2016-01-17 ENCOUNTER — Telehealth: Payer: Self-pay

## 2016-01-17 ENCOUNTER — Other Ambulatory Visit: Payer: Self-pay | Admitting: Internal Medicine

## 2016-01-17 MED ORDER — SERTRALINE HCL 100 MG PO TABS: 100.0000 mg | ORAL_TABLET | Freq: Every day | ORAL | Status: DC

## 2016-01-17 NOTE — Telephone Encounter (Signed)
New RX sent to [facility]

## 2016-01-17 NOTE — Telephone Encounter (Signed)
Pt left v/m; pt taking sertraline 50 mg taking one daily; for few days pt taking sertraline 100 mg and pt is feeling better and med is more effective. Pt request new rx for sertraline 100 mg taking one tab daily.Please advise. No recent f/u appt.[facility] [place].

## 2016-01-26 ENCOUNTER — Ambulatory Visit: Payer: BLUE CROSS/BLUE SHIELD | Admitting: Family Medicine

## 2016-02-02 ENCOUNTER — Ambulatory Visit: Payer: BLUE CROSS/BLUE SHIELD | Admitting: Family Medicine

## 2016-02-15 ENCOUNTER — Ambulatory Visit (INDEPENDENT_AMBULATORY_CARE_PROVIDER_SITE_OTHER): Payer: BLUE CROSS/BLUE SHIELD | Admitting: Internal Medicine

## 2016-02-15 ENCOUNTER — Encounter: Payer: Self-pay | Admitting: Internal Medicine

## 2016-02-15 VITALS — BP 118/76 | HR 74 | Temp 98.2°F | Wt 203.0 lb

## 2016-02-15 DIAGNOSIS — G5601 Carpal tunnel syndrome, right upper limb: Secondary | ICD-10-CM | POA: Diagnosis not present

## 2016-02-15 MED ORDER — NAPROXEN 375 MG PO TABS: 375.0000 mg | ORAL_TABLET | Freq: Two times a day (BID) | ORAL | Status: DC

## 2016-02-15 NOTE — Progress Notes (Signed)
Subjective:    Patient ID: Allison [name], female    DOB: 08/22/1976, 40 y.o.   MRN: [id number]  HPI  Pt presents to the clinic today with c/o right wrist pain. This started. It is intermittent. She reports it starts in her fingers and radiates into her mid forearm. She describes the pain as sore and achy. She has some associated tingling but denies numbness. She has taken Tylenol and Zanaflex with minimal relief. She denies any injury to the area. 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  Review of Systems  Past Medical History  Diagnosis Date  . Depression   . Thyroid disease     Current Outpatient Prescriptions  Medication Sig Dispense Refill  . clonazePAM (KLONOPIN) 0.5 MG tablet Take 1 tablet (0.5 mg total) by mouth 3 (three) times daily as needed for anxiety. 90 tablet 3  . sertraline (ZOLOFT) 100 MG tablet Take 1 tablet (100 mg total) by mouth daily. 30 tablet 3  . tiZANidine (ZANAFLEX) 4 MG tablet Take 1 tablet (4 mg total) by mouth Nightly. 30 tablet 2   No current facility-administered medications for this visit.    No Known Allergies  Family History  Problem Relation Age of Onset  . Mental illness Mother   . Arthritis Father   . Hypertension Father     Social History   Social History  . Marital Status: Married    Spouse Name: N/A  . Number of Children: N/A  . Years of Education: N/A   Occupational History  . Not on file.   Social History Main Topics  . Smoking status: Former Smoker    Types: Cigarettes    Quit date: 12/05/2007  . Smokeless tobacco: Never Used  . Alcohol Use: Yes     Comment: occ  . Drug Use: No  . Sexual Activity: Yes    Birth Control/ Protection: Surgical   Other Topics Concern  . Not on file   Social History Narrative     Constitutional: Denies fever, malaise, fatigue, headache or abrupt weight changes.  Respiratory: Denies difficulty breathing, shortness of breath, cough or sputum  production.   Cardiovascular: Denies chest pain, chest tightness, palpitations or swelling in the hands or feet.  Musculoskeletal: Pt reports right wrist pain. Denies decrease in range of motion, difficulty with gait, muscle pain or joint swelling.  Skin: Denies redness, rashes, lesions or ulcercations.  Neurological: Pt reports tinging in her right hand. Denies dizziness, difficulty with memory, difficulty with speech or problems with balance and coordination.    No other specific complaints in a complete review of systems (except as listed in HPI above).     Objective:   Physical Exam  BP 118/76 mmHg  Pulse 74  Temp(Src) 98.2 F (36.8 C) (Oral)  Wt 203 lb (92.08 kg)  SpO2 98%  LMP 02/02/2016 Wt Readings from Last 3 Encounters:  02/15/16 203 lb (92.08 kg)  01/05/16 203 lb (92.08 kg)  11/17/15 202 lb (91.627 kg)    General: Appears her stated age, well developed, well nourished in NAD. Skin: Warm, dry and intact. No rashes, lesions or ulcerations noted. Cardiovascular: Normal rate and rhythm. S1,S2 noted.  No murmur, rubs or gallops noted.  Pulmonary/Chest: Normal effort and positive vesicular breath sounds. No respiratory distress. No wheezes, rales or ronchi noted.  Musculoskeletal: Normal flexion, extension and rotation of the wrist. No swelling noted. Neurological: Alert and oriented. Negative Tinel's Positive Phalen.  BMET    Component Value Date/Time   NA 139 11/16/2015 1529   K 3.7 11/16/2015 1529   CL 103 11/16/2015 1529   CO2 30 11/16/2015 1529   GLUCOSE 124* 11/16/2015 1529   BUN 9 11/16/2015 1529   CREATININE 0.80 11/16/2015 1529   CALCIUM 9.4 11/16/2015 1529    Lipid Panel     Component Value Date/Time   CHOL 176 10/23/2013 1003   TRIG 198.0* 10/23/2013 1003   HDL 39.90 10/23/2013 1003   CHOLHDL 4 10/23/2013 1003   VLDL 39.6 10/23/2013 1003   LDLCALC 97 10/23/2013 1003    CBC    Component Value Date/Time   WBC 9.1 12/17/2014 1418   RBC 4.96  12/17/2014 1418   HGB 12.9 12/17/2014 1418   HCT 39.5 12/17/2014 1418   PLT 338.0 12/17/2014 1418   MCV 79.6 12/17/2014 1418   MCHC 32.6 12/17/2014 1418   RDW 15.2 12/17/2014 1418    Hgb A1C No results found for: HGBA1C       Assessment & Plan:   Carpal Tunnel right:  eRx for Naproxen 375 mg BID with meals Advised her to get a cock up splint to wear at night, while crocheting, etc If persist, consider referral to neurology for EMG testing  RTC as needed or if symptoms persist or worsen

## 2016-02-15 NOTE — Patient Instructions (Signed)

## 2016-02-15 NOTE — Progress Notes (Signed)
Pre visit review using our clinic review tool, if applicable. No additional management support is needed unless otherwise documented below in the visit note. 

## 2016-02-23 ENCOUNTER — Ambulatory Visit: Payer: BLUE CROSS/BLUE SHIELD | Admitting: Family Medicine

## 2016-03-24 IMAGING — CR DG CERVICAL SPINE COMPLETE 4+V
5 series · 5 of 5 positions shown · non-contrast
Comparison: None.

CLINICAL DATA: Cervical pain for 2 months, no known injury

EXAM:
CERVICAL SPINE  4+ VIEWS

[view not recorded (1 of 5)]
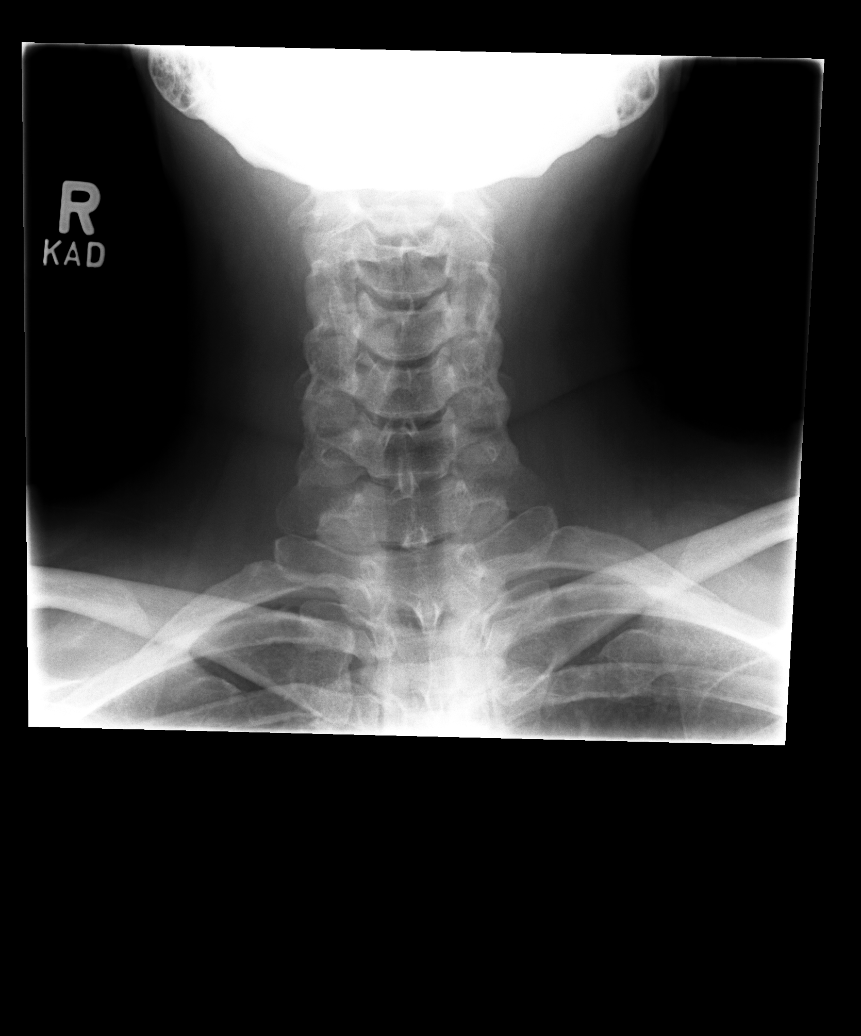

[view not recorded (2 of 5)]
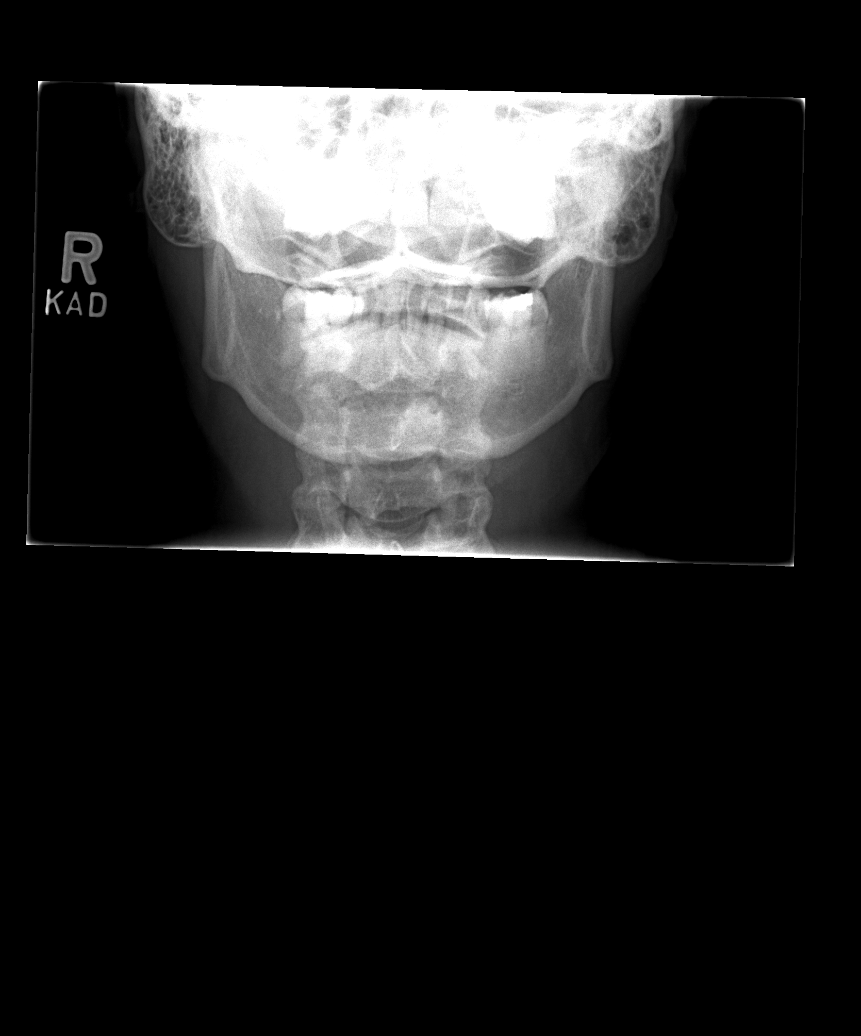

[view not recorded (3 of 5)]
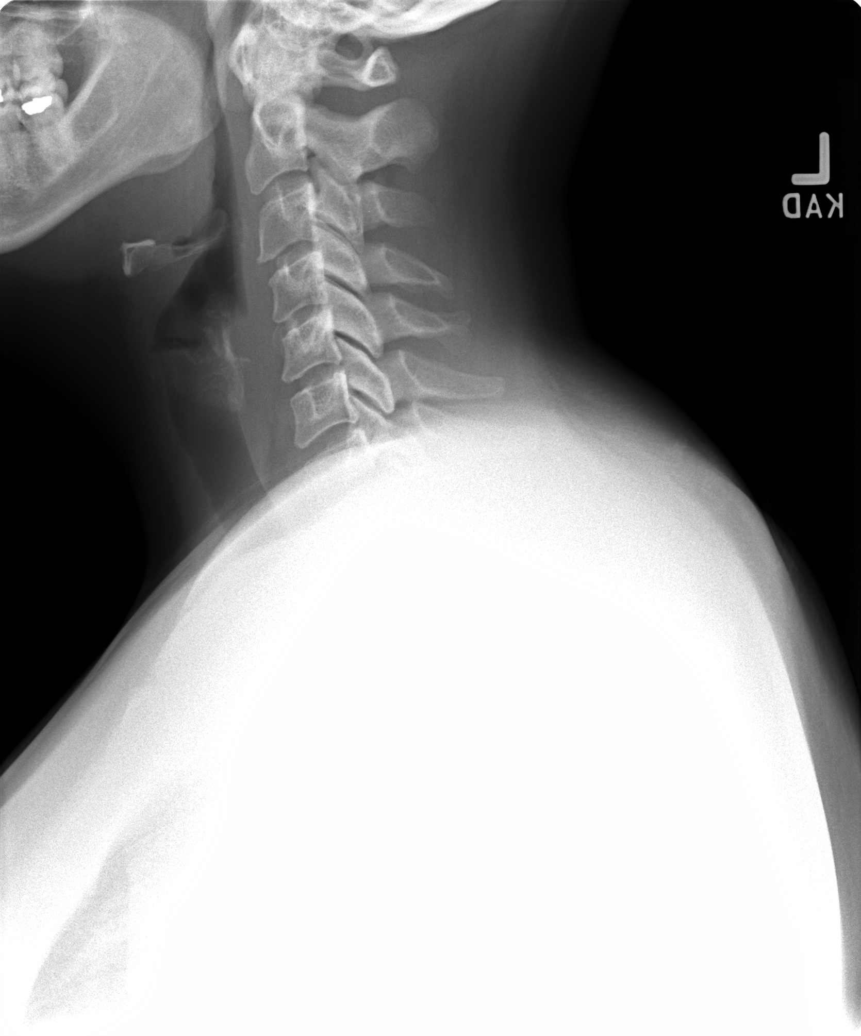

[view not recorded (4 of 5)]
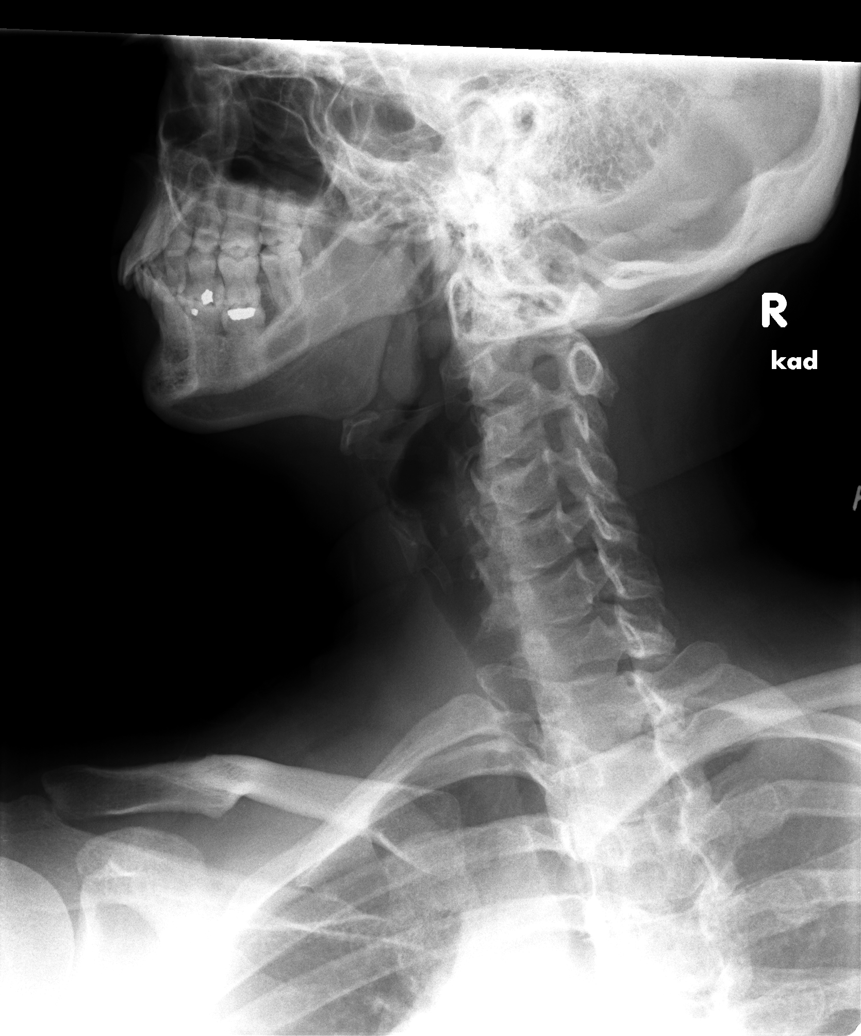

[view not recorded (5 of 5)]
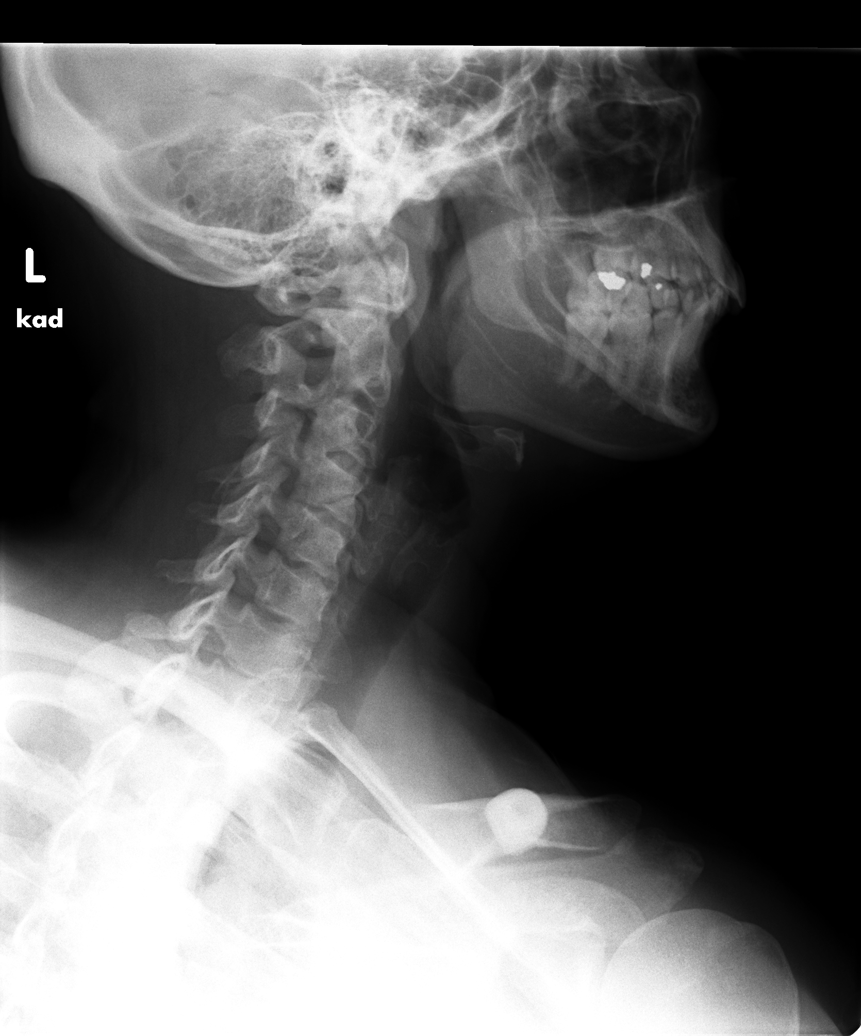

[5 of 5 positions shown; findings below may reference images not displayed]

FINDINGS: Five views of cervical spine submitted. No acute fracture or
subluxation. Mild anterior spurring lower endplate of C3 vertebral
body. Minimal disc space flattening at C4-C5 level. There is mild
narrowing of C3 neuro foramina bilaterally. No prevertebral soft
tissue swelling. Cervical airway is patent.
IMPRESSION: No acute fracture or subluxation. Mild degenerative changes as
described above.

## 2016-04-13 ENCOUNTER — Telehealth: Payer: Self-pay

## 2016-04-13 NOTE — Telephone Encounter (Signed)
PLEASE NOTE: All timestamps contained within this report are represented as [place] Standard Time. CONFIDENTIALTY NOTICE: This fax transmission is intended only for the addressee. It contains information that is legally privileged, confidential or otherwise protected from use or disclosure. If you are not the intended recipient, you are strictly prohibited from reviewing, disclosing, copying using or disseminating any of this information or taking any action in reliance on or regarding this information. If you have received this fax in error, please notify us immediately by telephone so that we can arrange for its return to us. Phone: [phone], Toll-Free: [phone], Fax: [phone] Page: 1 of 2 Call Id: [id number] [facility] Client TELEPHONE ADVICE RECORD [facility] Patient Name: Allison GurneyJULIE Rideout Gender: Female DOB: Jul 21, 1976 Age: 40 Y 4 M 21 D Return Phone Number: [phone] (Primary) Address: [address] [place]Zip: [place] [address] Client [facility] - Client Client Site [facility] - Day Physician Nicki ReaperBaity, Regina - NP Contact Type Call Who Is Calling Patient / Member / Family / Caregiver Call Type Triage / Clinical Caller Name Raynelle FanningJulie Relationship To Patient Self Return Phone Number [phone] (Primary) Chief Complaint Bruise Reason for Call Symptomatic / Request for Health Information Initial Comment Caller states she has a sore on the inner of her thigh that feels like a bruise. It is achy like a sore would be, but you cannot see it. Has history of blood clots in her family. Appointment Disposition EMR Appointment Attempted - Not Scheduled Info pasted into Epic No PreDisposition Search internet for information Translation No Nurse Assessment Nurse: Trudie ReedBurrell, RN, Tabatha Date/Time (Eastern Time): 04/13/2016 4:00:47 PM Confirm and document reason for call. If  symptomatic, describe symptoms. You must click the next button to save text entered. ---Caller states she has a sore on the inner of her thigh that feels like a bruise. It is achy like a sore would be, but you cannot see it. Has history of blood clots in her family. Has the patient traveled out of the country within the last 30 days? ---No Does the patient have any new or worsening symptoms? ---Yes Will a triage be completed? ---Yes Related visit to physician within the last 2 weeks? ---No Does the PT have any chronic conditions? (i.e. diabetes, asthma, etc.) ---Yes List chronic conditions. ---fibromyaslia Is the patient pregnant or possibly pregnant? (Ask all females between the ages of 3912-55) ---No Is this a behavioral health or substance abuse call? ---No PLEASE NOTE: All timestamps contained within this report are represented as [place] Standard Time. CONFIDENTIALTY NOTICE: This fax transmission is intended only for the addressee. It contains information that is legally privileged, confidential or otherwise protected from use or disclosure. If you are not the intended recipient, you are strictly prohibited from reviewing, disclosing, copying using or disseminating any of this information or taking any action in reliance on or regarding this information. If you have received this fax in error, please notify us immediately by telephone so that we can arrange for its return to us. Phone: [phone], Toll-Free: [phone], Fax: [phone] Page: 2 of 2 Call Id: [id number] Guidelines Guideline Title Affirmed Question Affirmed Notes Nurse Date/Time Lamount Cohen(Eastern Time) Skin Lump or Localized Swelling [1] Swelling is painful to touch AND [2] no fever Trudie ReedBurrell, RN, Tabatha 04/13/2016 4:06:58 PM Disp. Time Lamount Cohen(Eastern Time) Disposition Final User 04/13/2016 4:11:33 PM See Physician within 24 Hours Yes Trudie ReedBurrell, RN, Lang Snowabatha Caller Understands: Yes Disagree/Comply: Comply Care Advice  Given Per  Guideline SEE PHYSICIAN WITHIN 24 HOURS: * IF OFFICE WILL BE OPEN: You need to be seen within the next 24 hours. Call your doctor when the office opens, and make an appointment. CALL BACK IF: * Severe pain occurs * Fever occurs * You become worse. * Take 650 mg (two 325 mg pills) by mouth every 4-6 hours as needed. Each Regular Strength Tylenol pill has 325 mg of acetaminophen. The most you should take each day is 3,250 mg (10 Regular Strength pills a day). IBUPROFEN (E.G., MOTRIN, ADVIL): * Take 400 mg (two 200 mg pills) by mouth every 6 hours as needed. * Another choice is to take 600 mg (three 200 mg pills) by mouth every 8 hours as needed. Comments User: Lethea Killings, RN Date/Time Lamount Cohen Time): 04/13/2016 4:12:31 PM this RN does not have access to EPIC Referrals REFERRED TO PCP OFFICE

## 2016-04-13 NOTE — Telephone Encounter (Signed)
Pt has 30 min appt on 04/14/16 at 11 AM with Naomie Deanarrie Doss NP.

## 2016-04-14 ENCOUNTER — Ambulatory Visit (INDEPENDENT_AMBULATORY_CARE_PROVIDER_SITE_OTHER): Payer: BLUE CROSS/BLUE SHIELD | Admitting: Nurse Practitioner

## 2016-04-14 ENCOUNTER — Ambulatory Visit
Admission: RE | Admit: 2016-04-14 | Discharge: 2016-04-14 | Disposition: A | Discharge status: Home / Self Care | Payer: BLUE CROSS/BLUE SHIELD | Source: Ambulatory Visit | Attending: Nurse Practitioner | Admitting: Nurse Practitioner

## 2016-04-14 ENCOUNTER — Telehealth: Payer: Self-pay | Admitting: *Deleted

## 2016-04-14 ENCOUNTER — Encounter: Payer: Self-pay | Admitting: Nurse Practitioner

## 2016-04-14 VITALS — BP 110/68 | HR 80 | Resp 12 | Ht 65.0 in | Wt 202.0 lb

## 2016-04-14 DIAGNOSIS — R2242 Localized swelling, mass and lump, left lower limb: Secondary | ICD-10-CM | POA: Diagnosis present

## 2016-04-14 NOTE — Telephone Encounter (Signed)
Received call report from Ultrasound on Left leg doppler US for left inner thigh tenderness. Results: negative for DVT, no mass seen.

## 2016-04-14 NOTE — Progress Notes (Signed)
Patient ID: Allison [name], female    DOB: August 01, 1976  Age: 40 y.o. MRN: [id number]  CC: Follow-up   HPI Allison [name] presents for CC of left thigh lump x 1 month.   1) Mother had DVT history and pt is concerned about this possibly being one also.  No redness, swelling of thigh  Feels aware of it 1 month, intermittent burning  "Feels weird"  Denies aggravating factors or reliving factors Unsure of the first time she noticed it  Denies moving or change  2/10 in severity   No recent flying Week before Easter drove to beach 5 hours approx    Treatment to date:  ASA intermittently 1 tablet unsure of dosage   History Allison [name] has a past medical history of Depression and Thyroid disease.   She has past surgical history that includes Appendectomy and Tubal ligation.   Her family history includes Arthritis in her father; Hypertension in her father; Mental illness in her mother.She reports that she quit smoking about 8 years ago. Her smoking use included Cigarettes. She has never used smokeless tobacco. She reports that she drinks alcohol. She reports that she does not use illicit drugs.  Outpatient Prescriptions Prior to Visit  Medication Sig Dispense Refill  . clonazePAM (KLONOPIN) 0.5 MG tablet Take 1 tablet (0.5 mg total) by mouth 3 (three) times daily as needed for anxiety. 90 tablet 3  . sertraline (ZOLOFT) 100 MG tablet Take 1 tablet (100 mg total) by mouth daily. 30 tablet 3  . tiZANidine (ZANAFLEX) 4 MG tablet Take 1 tablet (4 mg total) by mouth Nightly. 30 tablet 2  . naproxen (NAPROSYN) 375 MG tablet Take 1 tablet (375 mg total) by mouth 2 (two) times daily with a meal. (Patient not taking: Reported on 04/14/2016) 60 tablet 0   No facility-administered medications prior to visit.    ROS Review of Systems  Constitutional: Negative for fever, chills, diaphoresis and fatigue.  Respiratory: Negative for chest tightness, shortness of breath and wheezing.    Cardiovascular: Negative for chest pain, palpitations and leg swelling.  Gastrointestinal: Negative for nausea, vomiting and diarrhea.  Skin: Negative for rash.       Mass of left thigh  Neurological: Negative for dizziness, weakness, numbness and headaches.  Psychiatric/Behavioral: The patient is not nervous/anxious.     Objective:  BP 110/68 mmHg  Pulse 80  Resp 12  Ht  (1.651 m)  Wt 202 lb (91.627 kg)  BMI 33.61 kg/m2  SpO2 99%  Physical Exam  Constitutional: She is oriented to person, place, and time. She appears well-developed and well-nourished. No distress.  HENT:  Head: Normocephalic and atraumatic.  Right Ear: External ear normal.  Left Ear: External ear normal.  Cardiovascular: Normal rate, regular rhythm and normal heart sounds.   Pulmonary/Chest: Effort normal and breath sounds normal. No respiratory distress. She has no wheezes. She has no rales. She exhibits no tenderness.  Neurological: She is alert and oriented to person, place, and time. No cranial nerve deficit. She exhibits normal muscle tone. Coordination normal.  Skin: Skin is warm and dry. No rash noted. She is not diaphoretic.  2 cm deep nodule, not well demarcated, not movable, is tender to palpation, no changes to skin  Psychiatric: She has a normal mood and affect. Her behavior is normal. Judgment and thought content normal.   Assessment & Plan:   Allison [name] was seen today for follow-up.  Diagnoses and all orders for this visit:  Mass of  thigh, left -     US Venous Img Lower Unilateral Left; Future   I have discontinued Allison [name]'s naproxen. I am also having her maintain her clonazePAM, tiZANidine, and sertraline.  No orders of the defined types were placed in this encounter.     Follow-up: No Follow-up on file.

## 2016-04-14 NOTE — Telephone Encounter (Signed)
Called patient. Gave lab results. Patient verbalized understanding and was very grateful.

## 2016-04-14 NOTE — Patient Instructions (Signed)
Please see our referral coordinators for more information.

## 2016-04-14 NOTE — Telephone Encounter (Signed)
Please call patient and let her know she is normal and there is nothing to be worried about!

## 2016-05-08 ENCOUNTER — Ambulatory Visit (INDEPENDENT_AMBULATORY_CARE_PROVIDER_SITE_OTHER): Payer: BLUE CROSS/BLUE SHIELD | Admitting: Internal Medicine

## 2016-05-08 ENCOUNTER — Encounter: Payer: Self-pay | Admitting: Internal Medicine

## 2016-05-08 ENCOUNTER — Telehealth: Payer: Self-pay | Admitting: Internal Medicine

## 2016-05-08 VITALS — BP 108/70 | HR 64 | Temp 98.7°F | Wt 201.0 lb

## 2016-05-08 DIAGNOSIS — G5603 Carpal tunnel syndrome, bilateral upper limbs: Secondary | ICD-10-CM | POA: Diagnosis not present

## 2016-05-08 NOTE — Telephone Encounter (Signed)
Ok with me 

## 2016-05-08 NOTE — Progress Notes (Signed)
Pre visit review using our clinic review tool, if applicable. No additional management support is needed unless otherwise documented below in the visit note. 

## 2016-05-08 NOTE — Telephone Encounter (Signed)
Pt is requesting to transfer care to Dr. Ermalene SearingBedsole due to financial reasons. Her insurance requires a MD and bills R. Baity visits as a specialist repeatedly and she would prefer to fix it by switching to Dr. Ermalene SearingBedsole. Please advise if this is OK to switch providers.

## 2016-05-08 NOTE — Telephone Encounter (Signed)
Okay with transfer of care

## 2016-05-08 NOTE — Progress Notes (Signed)
Subjective:    Patient ID: Allison [name], female    DOB: 12-Jan-1976, 40 y.o.   MRN: [id number]  HPI  Pt presents to the clinic today with c/o ongoing bilateral wrist pain, R>L. This started 3-4 months ago. It is intermittent but has become more frequent. She reports it starts in her fingers and radiates into her mid forearm. She describes the pain as sore and achy. She has some associated tingling but denies numbness. She has taken Tylenol and Zanaflex with minimal relief. She denies any injury to the area. 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 She was seen 02/2016 for the same and prescribed Naproxen and advised to get a cock up splint, but she reports this has not provided any relief.  Review of Systems  Past Medical History  Diagnosis Date  . Depression   . Thyroid disease     Current Outpatient Prescriptions  Medication Sig Dispense Refill  . clonazePAM (KLONOPIN) 0.5 MG tablet Take 1 tablet (0.5 mg total) by mouth 3 (three) times daily as needed for anxiety. 90 tablet 3  . naproxen (NAPROSYN) 375 MG tablet     . sertraline (ZOLOFT) 100 MG tablet Take 1 tablet (100 mg total) by mouth daily. 30 tablet 3  . tiZANidine (ZANAFLEX) 4 MG tablet Take 1 tablet (4 mg total) by mouth Nightly. 30 tablet 2   No current facility-administered medications for this visit.    No Known Allergies  Family History  Problem Relation Age of Onset  . Mental illness Mother   . Arthritis Father   . Hypertension Father     Social History   Social History  . Marital Status: Married    Spouse Name: N/A  . Number of Children: N/A  . Years of Education: N/A   Occupational History  . Not on file.   Social History Main Topics  . Smoking status: Former Smoker    Types: Cigarettes    Quit date: 12/05/2007  . Smokeless tobacco: Never Used  . Alcohol Use: Yes     Comment: occ  . Drug Use: No  . Sexual Activity: Yes    Birth Control/ Protection: Surgical     Other Topics Concern  . Not on file   Social History Narrative     Constitutional: Denies fever, malaise, fatigue, headache or abrupt weight changes.  Respiratory: Denies difficulty breathing, shortness of breath, cough or sputum production.   Cardiovascular: Denies chest pain, chest tightness, palpitations or swelling in the hands or feet.  Musculoskeletal: Pt reports bilateral wrist pain. Denies decrease in range of motion, difficulty with gait, muscle pain or joint swelling.  Skin: Denies redness, rashes, lesions or ulcercations.  Neurological: Pt reports tingling in her bilateral hand. Denies dizziness, difficulty with memory, difficulty with speech or problems with balance and coordination.    No other specific complaints in a complete review of systems (except as listed in HPI above).     Objective:   Physical Exam  BP 108/70 mmHg  Pulse 64  Temp(Src) 98.7 F (37.1 C) (Oral)  Wt 201 lb (91.173 kg)  SpO2 98%  LMP 04/29/2016  Wt Readings from Last 3 Encounters:  05/08/16 201 lb (91.173 kg)  04/14/16 202 lb (91.627 kg)  02/15/16 203 lb (92.08 kg)    General: Appears her stated age, well developed, well nourished in NAD. Skin: Warm, dry and intact. No rashes, lesions or ulcerations noted. Cardiovascular: Normal rate and rhythm. S1,S2  noted.  No murmur, rubs or gallops noted.  Pulmonary/Chest: Normal effort and positive vesicular breath sounds. No respiratory distress. No wheezes, rales or ronchi noted.  Musculoskeletal: Normal flexion, extension and rotation of the wrist. No swelling noted. Neurological: Alert and oriented. Positive Tinel's. Positive Phalen.   BMET    Component Value Date/Time   NA 139 11/16/2015 1529   K 3.7 11/16/2015 1529   CL 103 11/16/2015 1529   CO2 30 11/16/2015 1529   GLUCOSE 124* 11/16/2015 1529   BUN 9 11/16/2015 1529   CREATININE 0.80 11/16/2015 1529   CALCIUM 9.4 11/16/2015 1529    Lipid Panel     Component Value Date/Time    CHOL 176 10/23/2013 1003   TRIG 198.0* 10/23/2013 1003   HDL 39.90 10/23/2013 1003   CHOLHDL 4 10/23/2013 1003   VLDL 39.6 10/23/2013 1003   LDLCALC 97 10/23/2013 1003    CBC    Component Value Date/Time   WBC 9.1 12/17/2014 1418   RBC 4.96 12/17/2014 1418   HGB 12.9 12/17/2014 1418   HCT 39.5 12/17/2014 1418   PLT 338.0 12/17/2014 1418   MCV 79.6 12/17/2014 1418   MCHC 32.6 12/17/2014 1418   RDW 15.2 12/17/2014 1418    Hgb A1C No results found for: HGBA1C       Assessment & Plan:   Carpal Tunnel, bilateral:  She declines RX for Prednisone to see if it helps Continue Naproxen and night splints for now Referral to neurology for EMG testing  RTC as needed or if symptoms persist or worsen

## 2016-05-08 NOTE — Patient Instructions (Signed)

## 2016-05-15 ENCOUNTER — Encounter: Payer: Self-pay | Admitting: Neurology

## 2016-05-15 ENCOUNTER — Ambulatory Visit (INDEPENDENT_AMBULATORY_CARE_PROVIDER_SITE_OTHER): Payer: BLUE CROSS/BLUE SHIELD | Admitting: Neurology

## 2016-05-15 VITALS — BP 108/72 | HR 84 | Resp 14 | Ht 64.0 in | Wt 203.2 lb

## 2016-05-15 DIAGNOSIS — M542 Cervicalgia: Secondary | ICD-10-CM

## 2016-05-15 DIAGNOSIS — R29898 Other symptoms and signs involving the musculoskeletal system: Secondary | ICD-10-CM

## 2016-05-15 DIAGNOSIS — IMO0001 Reserved for inherently not codable concepts without codable children: Secondary | ICD-10-CM

## 2016-05-15 DIAGNOSIS — R209 Unspecified disturbances of skin sensation: Secondary | ICD-10-CM

## 2016-05-15 DIAGNOSIS — G5603 Carpal tunnel syndrome, bilateral upper limbs: Secondary | ICD-10-CM | POA: Diagnosis not present

## 2016-05-15 DIAGNOSIS — M5010 Cervical disc disorder with radiculopathy, unspecified cervical region: Secondary | ICD-10-CM

## 2016-05-15 NOTE — Progress Notes (Signed)
[facility]    Provider:  Dr Lucia Gaskins Referring Provider: Nicki Reaper, MD Primary Care Physician:  Nicki Reaper, MD  CC:  Carpal tunnel syndrome  HPI:  Allison [name] is a 40 y.o. female here as a referral from Dr. Ermalene Searing for carpal tunnel syndrome. Symptoms started a few months ago. The pain is moderate. They are symmetric in pain. Tingly in the whole hand. Like it is asleep. Wakes her up in the middle of the night. Splints didn't help. Nothing helps. Worse in the night and morning or with use. Continuous symptoms. No cramps. No inciting events or trauma, She has weakness in the arms, weaknes o grip, dropping objects. She has neck pain. She has a lot of neck pain and has had just xrays and sees someone for adjustments.She gets shooting pain into her arms from her neck. No other associated symptoms. No other focal neurologic deficits currently but she does have a history of transient neurologic deficits and she worries about multiple sclerosis. No change in bowel or bladder. Tylenol and Zanaflex to help. 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  Reviewed notes, labs and imaging from outside physicians, which showed: Pt presented to her with c/o ongoing bilateral wrist pain, R>L. This started 3-4 months ago. Symptoms described as intermittent but become more frequent. She reported it starts in her fingers and radiates into her mid forearm. She described the pain as sore and achy. She has some associated tingling but denied numbness. She has taken Tylenol and Zanaflex with minimal relief. She denied any injury to the area. 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 She was seen 02/2016 for the same and prescribed Naproxen and advised to get a wrist splint, but she reported this had not provided any relief. She was referred to neurology for further evaluation.  COMPARISON: None.  DG. Cervical  spine 05/24/2015 (personally reviewed images and agree with the following): Five views of cervical spine submitted. No acute fracture or subluxation. Mild anterior spurring lower endplate of C3 vertebral body. Minimal disc space flattening at C4-C5 level. There is mild narrowing of C3 neuro foramina bilaterally. No prevertebral soft tissue swelling. Cervical airway is patent.  IMPRESSION: No acute fracture or subluxation. Mild degenerative changes as  described above.  CMP in  December 2016 was normal except for elevated glucose of124, creatinine was normal at 0.80   Review of Systems: Patient complains of symptoms per HPI as well as the following symptoms: Fatigue, blurred vision, joint pain, joint swelling, aching muscles, trouble swallowing, headache, numbness, weakness, sleepiness, depression, anxiety, decreased energy. Pertinent negatives per HPI. All others negative.   Social History   Social History  . Marital Status: Married    Spouse Name: N/A  . Number of Children: N/A  . Years of Education: N/A   Occupational History  . Not on file.   Social History Main Topics  . Smoking status: Former Smoker    Types: Cigarettes    Quit date: 12/05/2007  . Smokeless tobacco: Never Used  . Alcohol Use: Yes     Comment: occ  . Drug Use: No  . Sexual Activity: Yes    Birth Control/ Protection: Surgical   Other Topics Concern  . Not on file   Social History Narrative    Family History  Problem Relation Age of Onset  . Mental illness Mother   . Arthritis Father   . Hypertension  Father   . Neuropathy Neg Hx   . Migraines Neg Hx     Past Medical History  Diagnosis Date  . Depression   . Thyroid disease   . Neuropathy [facility])     Past Surgical History  Procedure Laterality Date  . Appendectomy    . Tubal ligation      Current Outpatient Prescriptions  Medication Sig Dispense Refill  . clonazePAM (KLONOPIN) 0.5 MG tablet Take 1 tablet (0.5 mg total) by mouth 3  (three) times daily as needed for anxiety. 90 tablet 3  . sertraline (ZOLOFT) 100 MG tablet Take 1 tablet (100 mg total) by mouth daily. 30 tablet 3  . tiZANidine (ZANAFLEX) 4 MG tablet Take 1 tablet (4 mg total) by mouth Nightly. 30 tablet 2   No current facility-administered medications for this visit.    Allergies as of 05/15/2016  . (No Known Allergies)    Vitals: BP 108/72 mmHg  Pulse 84  Resp 14  Ht  (1.626 m)  Wt 203 lb 3.2 oz (92.171 kg)  BMI 34.86 kg/m2  LMP 04/29/2016 Last Weight:  Wt Readings from Last 1 Encounters:  05/15/16 203 lb 3.2 oz (92.171 kg)   Last Height:   Ht Readings from Last 1 Encounters:  05/15/16  (1.626 m)   Physical exam: Exam: Gen: NAD, conversant, well nourised, obese, well groomed                     CV: RRR, no MRG. No Carotid Bruits. No peripheral edema, warm, nontender Eyes: Conjunctivae clear without exudates or hemorrhage  Neuro: Detailed Neurologic Exam  Speech:    Speech is normal; fluent and spontaneous with normal comprehension.  Cognition:    The patient is oriented to person, place, and time;     recent and remote memory intact;     language fluent;     normal attention, concentration,     fund of knowledge Cranial Nerves:    The pupils are equal, round, and reactive to light. The fundi are normal and spontaneous venous pulsations are present. Visual fields are full to finger confrontation. Extraocular movements are intact. Trigeminal sensation is intact and the muscles of mastication are normal. The face is symmetric. The palate elevates in the midline. Hearing intact. Voice is normal. Shoulder shrug is normal. The tongue has normal motion without fasciculations.   Coordination:    Normal finger to nose and heel to shin. Normal rapid alternating movements.   Gait:    Heel-toe and tandem gait are normal.   Motor Observation:    No asymmetry, no atrophy, and no involuntary movements noted. Tone:    Normal  muscle tone.    Posture:    Posture is normal. normal erect    Strength: proximal weakness in the arms mostly deltoids and mild intrinsic hand weakness    Otherwise Strength is V/V in the upper and lower limbs.      Sensation: intact to pinprick, no deficits noted on testing.      Reflex Exam:  DTR's:    Deep tendon reflexes in the upper and lower extremities are brisk bilaterally.   Toes:    The toes are downgoing bilaterally.   Clonus:    Clonus is absent.       Assessment/Plan:   40 year old here for evaluation of hand pain, tingling, arm weakness, neck pain and radicular symptoms.  EMG nerve conduction study of the bilateral upper extremities to evaluate for  neuropathy and/or radiculopathy. MRI of the cervical cord to evaluate for radiculopathy due to chronic neck pain and radicular symptoms.  Cc: Nicki Reaperegina Baity, MD   Naomie DeanAntonia Zaviyar Rahal, MD  [facility] [address] [place], [place] [id number]  Phone [phone] Fax [phone]

## 2016-05-15 NOTE — Patient Instructions (Addendum)
Remember to drink plenty of fluid, eat healthy meals and do not skip any meals. Try to eat protein with a every meal and eat a healthy snack such as fruit or nuts in between meals. Try to keep a regular sleep-wake schedule and try to exercise daily, particularly in the form of walking, 20-30 minutes a day, if you can.   As far as diagnostic testing: MRI of the cervical spine  Emg/ncs to be scheduled. Please call us with any interim questions, concerns, problems, updates or refill requests.   Our phone number is [phone]. We also have an after hours call service for urgent matters and there is a physician on-call for urgent questions. For any emergencies you know to call 911 or go to the nearest emergency room

## 2016-05-16 ENCOUNTER — Encounter: Payer: Self-pay | Admitting: Neurology

## 2016-05-22 ENCOUNTER — Ambulatory Visit (INDEPENDENT_AMBULATORY_CARE_PROVIDER_SITE_OTHER): Payer: Self-pay | Admitting: Neurology

## 2016-05-22 ENCOUNTER — Ambulatory Visit (INDEPENDENT_AMBULATORY_CARE_PROVIDER_SITE_OTHER): Payer: BLUE CROSS/BLUE SHIELD | Admitting: Neurology

## 2016-05-22 DIAGNOSIS — M2550 Pain in unspecified joint: Secondary | ICD-10-CM

## 2016-05-22 DIAGNOSIS — E538 Deficiency of other specified B group vitamins: Secondary | ICD-10-CM

## 2016-05-22 DIAGNOSIS — R7302 Impaired glucose tolerance (oral): Secondary | ICD-10-CM

## 2016-05-22 DIAGNOSIS — M791 Myalgia, unspecified site: Secondary | ICD-10-CM

## 2016-05-22 DIAGNOSIS — R202 Paresthesia of skin: Secondary | ICD-10-CM

## 2016-05-22 DIAGNOSIS — G5603 Carpal tunnel syndrome, bilateral upper limbs: Secondary | ICD-10-CM

## 2016-05-22 DIAGNOSIS — Z0289 Encounter for other administrative examinations: Secondary | ICD-10-CM

## 2016-05-22 DIAGNOSIS — R29898 Other symptoms and signs involving the musculoskeletal system: Secondary | ICD-10-CM

## 2016-05-22 NOTE — Progress Notes (Signed)
  [facility]    Provider:  Dr Lucia GaskinsAhern Referring Provider: Nicki Reaperegina Baity, MD  History:  Myriam JacobsonJulie Annette Griffiths is a 40 y.o. female here as a referral from Dr. Ermalene SearingBedsole for evaluation of carpal tunnel syndrome. She has significant neck pain with radicular symptoms.  Summary  Nerve conduction studies were performed on the bilateral upper extremities:  The bilateral Median motor nerves showed normal conductions with normal F Wave latency The bilateral Ulnar motor nerves showed normal conductions with normal F Wave latency The bilateral second-digit Median sensory nerves were within normal limits The bilateral fifth-digit Ulnar sensory nerves were within normal limits The bilateral Radial sensory nerves were within normal limits  EMG Needle study was performed on selected right upper extremity muscles:   The right Deltoid, right Triceps, right Pronator Teres, right Opponens Pollicis, right First Dorsal interosseous, right Extensor Indicis were within normal limits.  She declined paraspinals.  Conclusion: This is a normal study. No electrophysiologic evidence for ulnar or median neuropathy, peripheral polyneuropathy, cervical radiculopathy. Recommend MRI of the cervical spine.  Naomie DeanAntonia Ahern, MD  [facility] [address] [place], [place] [id number]  Phone [phone] Fax [phone]

## 2016-05-23 ENCOUNTER — Telehealth: Payer: Self-pay | Admitting: *Deleted

## 2016-05-23 NOTE — Telephone Encounter (Signed)
Called and spoke to pt about normal labs. She verbalized understanding.

## 2016-05-23 NOTE — Telephone Encounter (Signed)
-----   Message from Anson FretAntonia B Ahern, MD sent at 05/23/2016  5:15 PM EDT ----- Labs normal thanks

## 2016-05-23 NOTE — Progress Notes (Signed)
See procedure note.

## 2016-05-24 LAB — ANA: ANA TITER 1: NEGATIVE

## 2016-05-24 LAB — ANA COMPREHENSIVE PANEL
DSDNA AB: 1 [IU]/mL (ref 0–9)
ENA RNP Ab: 0.2 AI (ref 0.0–0.9)
ENA SM Ab Ser-aCnc: <0.2 AI (ref 0.0–0.9)
ENA SSA (RO) AB: 0.3 AI (ref 0.0–0.9)
ENA SSB (LA) Ab: <0.2 AI (ref 0.0–0.9)
Scleroderma SCL-70: <0.2 AI (ref 0.0–0.9)

## 2016-05-24 LAB — RHEUMATOID FACTOR: Rhuematoid fact SerPl-aCnc: <10 IU/mL (ref 0.0–13.9)

## 2016-05-24 LAB — B12 AND FOLATE PANEL
Folate: 15.7 ng/mL (ref >3.0)
VITAMIN B 12: 785 pg/mL (ref 211–946)

## 2016-05-24 LAB — HEMOGLOBIN A1C
Est. average glucose Bld gHb Est-mCnc: 108 mg/dL
Hgb A1c MFr Bld: 5.4 % (ref 4.8–5.6)

## 2016-05-24 LAB — HEAVY METALS, BLOOD
ARSENIC: 7 ug/L (ref 2–23)
Lead, Blood: NOT DETECTED ug/dL (ref 0–19)
MERCURY: 4.3 ug/L (ref 0.0–14.9)

## 2016-05-24 LAB — B. BURGDORFI ANTIBODIES: Lyme IgG/IgM Ab: <0.91 {ISR} (ref 0.00–0.90)

## 2016-05-29 NOTE — Telephone Encounter (Signed)
error 

## 2016-05-30 ENCOUNTER — Other Ambulatory Visit: Payer: Self-pay | Admitting: Family Medicine

## 2016-05-31 ENCOUNTER — Ambulatory Visit (INDEPENDENT_AMBULATORY_CARE_PROVIDER_SITE_OTHER): Payer: BLUE CROSS/BLUE SHIELD

## 2016-05-31 DIAGNOSIS — R29898 Other symptoms and signs involving the musculoskeletal system: Secondary | ICD-10-CM

## 2016-05-31 DIAGNOSIS — M542 Cervicalgia: Secondary | ICD-10-CM | POA: Diagnosis not present

## 2016-05-31 DIAGNOSIS — M5010 Cervical disc disorder with radiculopathy, unspecified cervical region: Secondary | ICD-10-CM | POA: Diagnosis not present

## 2016-05-31 DIAGNOSIS — R209 Unspecified disturbances of skin sensation: Secondary | ICD-10-CM

## 2016-05-31 DIAGNOSIS — IMO0001 Reserved for inherently not codable concepts without codable children: Secondary | ICD-10-CM

## 2016-06-07 ENCOUNTER — Other Ambulatory Visit: Payer: Self-pay | Admitting: Neurology

## 2016-06-07 ENCOUNTER — Telehealth: Payer: Self-pay

## 2016-06-07 DIAGNOSIS — R29898 Other symptoms and signs involving the musculoskeletal system: Secondary | ICD-10-CM

## 2016-06-07 DIAGNOSIS — R200 Anesthesia of skin: Secondary | ICD-10-CM

## 2016-06-07 DIAGNOSIS — R202 Paresthesia of skin: Secondary | ICD-10-CM

## 2016-06-07 DIAGNOSIS — R29818 Other symptoms and signs involving the nervous system: Secondary | ICD-10-CM

## 2016-06-07 NOTE — Telephone Encounter (Signed)
-----   Message from Anson FretAntonia B Ahern, MD sent at 06/02/2016  1:00 PM EDT ----- MRi of the cervical spine shows no pinched nerve, some very minimal arthritic changes but otherwise looks very good. No cause her her hand pain.

## 2016-06-07 NOTE — Telephone Encounter (Signed)
I ordered MRI of the brain

## 2016-06-07 NOTE — Telephone Encounter (Signed)
Rn call patient about MRI cervical spine results. Rn stated no pinch nerve and minimal arthritic changes but looks good. No cause for her hand pain. PT stated all the test does not explain her sympts. Pt stated if Dr.Ahern wants a MRI of brain. PT verbalized understanding. Rn stated message will be sent to her.

## 2016-06-08 NOTE — Telephone Encounter (Signed)
Called and LVM informing pt Dr Lucia GaskinsAhern ordered MRI brain. Someone will call to schedule the MRI. Her insurance has to be processed first. Advised her to call if she has further questions. Gave [facility] phone number.

## 2016-06-25 ENCOUNTER — Ambulatory Visit
Admission: RE | Admit: 2016-06-25 | Discharge: 2016-06-25 | Disposition: A | Discharge status: Home / Self Care | Payer: BLUE CROSS/BLUE SHIELD | Source: Ambulatory Visit | Attending: Neurology | Admitting: Neurology

## 2016-06-25 DIAGNOSIS — R202 Paresthesia of skin: Secondary | ICD-10-CM

## 2016-06-25 DIAGNOSIS — R200 Anesthesia of skin: Secondary | ICD-10-CM

## 2016-06-25 DIAGNOSIS — R29818 Other symptoms and signs involving the nervous system: Secondary | ICD-10-CM

## 2016-06-25 DIAGNOSIS — R29898 Other symptoms and signs involving the musculoskeletal system: Secondary | ICD-10-CM | POA: Diagnosis not present

## 2016-06-25 MED ORDER — GADOBENATE DIMEGLUMINE 529 MG/ML IV SOLN
19.0000 mL | Freq: Once | INTRAVENOUS | Status: AC | PRN
  Administered 2016-06-25: 19 mL via INTRAVENOUS

## 2016-06-28 ENCOUNTER — Other Ambulatory Visit: Payer: Self-pay | Admitting: *Deleted

## 2016-06-28 DIAGNOSIS — G5603 Carpal tunnel syndrome, bilateral upper limbs: Secondary | ICD-10-CM

## 2016-08-11 ENCOUNTER — Ambulatory Visit (INDEPENDENT_AMBULATORY_CARE_PROVIDER_SITE_OTHER): Payer: BLUE CROSS/BLUE SHIELD

## 2016-08-11 DIAGNOSIS — Z23 Encounter for immunization: Secondary | ICD-10-CM

## 2016-08-29 ENCOUNTER — Other Ambulatory Visit (INDEPENDENT_AMBULATORY_CARE_PROVIDER_SITE_OTHER): Payer: BLUE CROSS/BLUE SHIELD

## 2016-08-29 ENCOUNTER — Telehealth: Payer: Self-pay | Admitting: Family Medicine

## 2016-08-29 ENCOUNTER — Encounter: Payer: Self-pay | Admitting: *Deleted

## 2016-08-29 DIAGNOSIS — Z13220 Encounter for screening for lipoid disorders: Secondary | ICD-10-CM

## 2016-08-29 DIAGNOSIS — Z8349 Family history of other endocrine, nutritional and metabolic diseases: Secondary | ICD-10-CM

## 2016-08-29 LAB — COMPREHENSIVE METABOLIC PANEL
ALBUMIN: 4 g/dL (ref 3.5–5.2)
ALK PHOS: 95 U/L (ref 39–117)
ALT: 12 U/L (ref 0–35)
AST: 15 U/L (ref 0–37)
BILIRUBIN TOTAL: 0.5 mg/dL (ref 0.2–1.2)
BUN: 9 mg/dL (ref 6–23)
CALCIUM: 9.3 mg/dL (ref 8.4–10.5)
CHLORIDE: 102 meq/L (ref 96–112)
CO2: 29 mEq/L (ref 19–32)
CREATININE: 0.79 mg/dL (ref 0.40–1.20)
GFR: 86.22 mL/min (ref >60.00)
Glucose, Bld: 93 mg/dL (ref 70–99)
Potassium: 4 mEq/L (ref 3.5–5.1)
SODIUM: 138 meq/L (ref 135–145)
TOTAL PROTEIN: 7.8 g/dL (ref 6.0–8.3)

## 2016-08-29 LAB — LIPID PANEL
CHOLESTEROL: 141 mg/dL (ref 0–200)
HDL: 40.7 mg/dL (ref >39.00)
LDL Cholesterol: 82 mg/dL (ref 0–99)
NonHDL: 100.02
TRIGLYCERIDES: 89 mg/dL (ref 0.0–149.0)
Total CHOL/HDL Ratio: 3
VLDL: 17.8 mg/dL (ref 0.0–40.0)

## 2016-08-29 LAB — TSH: TSH: 2.18 u[IU]/mL (ref 0.35–4.50)

## 2016-08-29 LAB — T4, FREE: FREE T4: 0.86 ng/dL (ref 0.60–1.60)

## 2016-08-29 NOTE — Telephone Encounter (Signed)
-----   Message from Alvina Chouerri J Walsh sent at 08/24/2016  1:00 PM EDT ----- Regarding: Lab orders for Tuesday, 9.26.17 Patient is scheduled for CPX labs, please order future labs, Thanks , Camelia Engerri

## 2016-09-05 ENCOUNTER — Encounter: Payer: Self-pay | Admitting: Radiology

## 2016-09-05 ENCOUNTER — Ambulatory Visit (INDEPENDENT_AMBULATORY_CARE_PROVIDER_SITE_OTHER): Payer: BLUE CROSS/BLUE SHIELD | Admitting: Family Medicine

## 2016-09-05 ENCOUNTER — Encounter: Payer: Self-pay | Admitting: Family Medicine

## 2016-09-05 VITALS — BP 100/60 | HR 74 | Temp 98.4°F | Ht 64.5 in | Wt 201.0 lb

## 2016-09-05 DIAGNOSIS — Z8639 Personal history of other endocrine, nutritional and metabolic disease: Secondary | ICD-10-CM

## 2016-09-05 DIAGNOSIS — Z6833 Body mass index (BMI) 33.0-33.9, adult: Secondary | ICD-10-CM

## 2016-09-05 DIAGNOSIS — Z0000 Encounter for general adult medical examination without abnormal findings: Secondary | ICD-10-CM

## 2016-09-05 DIAGNOSIS — E6609 Other obesity due to excess calories: Secondary | ICD-10-CM | POA: Diagnosis not present

## 2016-09-05 DIAGNOSIS — F411 Generalized anxiety disorder: Secondary | ICD-10-CM | POA: Diagnosis not present

## 2016-09-05 DIAGNOSIS — F4322 Adjustment disorder with anxiety: Secondary | ICD-10-CM

## 2016-09-05 DIAGNOSIS — F319 Bipolar disorder, unspecified: Secondary | ICD-10-CM | POA: Insufficient documentation

## 2016-09-05 DIAGNOSIS — R110 Nausea: Secondary | ICD-10-CM

## 2016-09-05 DIAGNOSIS — F39 Unspecified mood [affective] disorder: Secondary | ICD-10-CM | POA: Insufficient documentation

## 2016-09-05 DIAGNOSIS — F331 Major depressive disorder, recurrent, moderate: Secondary | ICD-10-CM

## 2016-09-05 MED ORDER — SERTRALINE HCL 100 MG PO TABS: 100.0000 mg | ORAL_TABLET | Freq: Every day | ORAL | 6 refills | Status: DC

## 2016-09-05 MED ORDER — CLONAZEPAM 0.5 MG PO TABS: 0.5000 mg | ORAL_TABLET | Freq: Every day | ORAL | 0 refills | Status: DC | PRN

## 2016-09-05 NOTE — Progress Notes (Signed)
Pre visit review using our clinic review tool, if applicable. No additional management support is needed unless otherwise documented below in the visit note. 

## 2016-09-05 NOTE — Assessment & Plan Note (Signed)
Nml thyroid testing today.

## 2016-09-05 NOTE — Assessment & Plan Note (Signed)
Stable on current dose of sertraline, 100 mg.

## 2016-09-05 NOTE — Patient Instructions (Addendum)
Work on healthy eating and regular exercise and weight loss.  Stop at lab on way out for substance agreement and testing.

## 2016-09-05 NOTE — Progress Notes (Signed)
Subjective:    Patient ID: Allison [name], female    DOB: 10-06-1976, 40 y.o.   MRN: [id number]  HPI   40 year old female previous pt of Nicki Reaper presents to establish with me and to have her CPX performed. She does she GYN yearly [facility]). Last OV 2 weeks ago. Abn pap in past.. Had colposcopy.  Nml PAP 2 weeks ago. Having irregular menses.. Plans  Bx endometrial.  Had Grave's  Disease, now resolved after ablation.   Lab Results  Component Value Date   TSH 2.18 08/29/2016    Wt Readings from Last 3 Encounters:  09/05/16 201 lb (91.2 kg)  05/15/16 203 lb 3.2 oz (92.2 kg)  05/08/16 201 lb (91.2 kg)   Body mass index is 33.97 kg/m.    Moderate MDD, GAD: stable on sertraline  PHQ9:2   Reviewed labs in detail.  Lab Results  Component Value Date   CHOL 141 08/29/2016   HDL 40.70 08/29/2016   LDLCALC 82 08/29/2016   TRIG 89.0 08/29/2016   CHOLHDL 3 08/29/2016   Exercise: walking  3 x a week, plans 5 K. Diet: skips meals, loves cheese.   Social History /Family History/Past Medical History reviewed and updated if needed.  She has 3 children.  Review of Systems  Constitutional: Negative for fatigue and fever.  HENT: Negative for ear pain.   Eyes: Negative for pain.  Respiratory: Negative for chest tightness and shortness of breath.   Cardiovascular: Negative for chest pain, palpitations and leg swelling.  Gastrointestinal: Negative for abdominal pain.  Genitourinary: Negative for dysuria.       Objective:   Physical Exam  Constitutional: Vital signs are normal. She appears well-developed and well-nourished. She is cooperative.  Non-toxic appearance. She does not appear ill. No distress.  HENT:  Head: Normocephalic.  Right Ear: Hearing, tympanic membrane, external ear and ear canal normal.  Left Ear: Hearing, tympanic membrane, external ear and ear canal normal.  Nose: Nose normal.  Eyes: Conjunctivae, EOM and lids are normal. Pupils  are equal, round, and reactive to light. Lids are everted and swept, no foreign bodies found.  Neck: Trachea normal and normal range of motion. Neck supple. Carotid bruit is not present. No thyroid mass and no thyromegaly present.  Cardiovascular: Normal rate, regular rhythm, S1 normal, S2 normal, normal heart sounds and intact distal pulses.  Exam reveals no gallop.   No murmur heard. Pulmonary/Chest: Effort normal and breath sounds normal. No respiratory distress. She has no wheezes. She has no rhonchi. She has no rales.  Abdominal: Soft. Normal appearance and bowel sounds are normal. She exhibits no distension, no fluid wave, no abdominal bruit and no mass. There is no hepatosplenomegaly. There is no tenderness. There is no rebound, no guarding and no CVA tenderness. No hernia.  Lymphadenopathy:    She has no cervical adenopathy.    She has no axillary adenopathy.  Neurological: She is alert. She has normal strength. No cranial nerve deficit or sensory deficit.  Skin: Skin is warm, dry and intact. No rash noted.  Psychiatric: Her speech is normal and behavior is normal. Judgment normal. Her mood appears not anxious. Cognition and memory are normal. She does not exhibit a depressed mood.          Assessment & Plan:  The patient's preventative maintenance and recommended screening tests for an annual wellness exam were reviewed in full today. Brought up to date unless services declined.  Counselled  on the importance of diet, exercise, and its role in overall health and mortality. The patient's FH and SH was reviewed, including their home life, tobacco status, and drug and alcohol status.   Uptodate with flu and TDap

## 2016-09-05 NOTE — Assessment & Plan Note (Signed)
Encouraged exercise, weight loss, healthy eating habits. ? ?

## 2016-09-05 NOTE — Assessment & Plan Note (Addendum)
Well controlled on sertraline. Using clonazepam, out but using rarely.   Husband out of town so she did have panic attack yesterday.  Unable to get into [facility] today, will try at next OV.

## 2016-09-25 ENCOUNTER — Encounter: Payer: Self-pay | Admitting: Family Medicine

## 2017-02-10 ENCOUNTER — Ambulatory Visit (INDEPENDENT_AMBULATORY_CARE_PROVIDER_SITE_OTHER): Payer: BLUE CROSS/BLUE SHIELD | Admitting: Family Medicine

## 2017-02-10 ENCOUNTER — Encounter: Payer: Self-pay | Admitting: Family Medicine

## 2017-02-10 DIAGNOSIS — R69 Illness, unspecified: Secondary | ICD-10-CM

## 2017-02-10 DIAGNOSIS — J111 Influenza due to unidentified influenza virus with other respiratory manifestations: Secondary | ICD-10-CM | POA: Insufficient documentation

## 2017-02-10 NOTE — Progress Notes (Signed)
   Subjective:  Patient ID: Allison [name], Allison [name]    DOB: Jun 17, 1976  Age: 41 y.o. MRN: [id number]  CC: Concern for flu  HPI:  41 year old Allison [name] presents with concerns for influenza.  Patient states that her symptoms started yesterday. She reports sudden onset diffuse body aches. She's had head congestion, sore throat, ear pain, cough. She's had associated low-grade temperature (100). She's had several sick contacts recently. Symptoms are severe. No known exacerbating or relieving factors. No other associated symptoms. No other complaints or concerns at this time.  Social Hx   Social History   Social History  . Marital status: Married    Spouse name: N/A  . Number of children: N/A  . Years of education: N/A   Social History Main Topics  . Smoking status: Former Smoker    Packs/day: 0.50    Years: 5.00    Types: Cigarettes    Quit date: 12/05/2007  . Smokeless tobacco: Never Used  . Alcohol use Yes     Comment: occ, 1-2 a week  . Drug use: No  . Sexual activity: Yes    Birth control/ protection: Surgical   Other Topics Concern  . None   Social History Narrative  . None    Review of Systems  HENT: Positive for congestion, ear pain and sore throat.   Respiratory: Positive for cough.   Musculoskeletal:       Body aches.    Objective:  BP 116/78   Pulse 88   Temp 98.3 F (36.8 C) (Oral)   Wt 199 lb (90.3 kg)   SpO2 97%   BMI 33.63 kg/m   BP/Weight 02/10/2017 09/05/2016 05/15/2016  Systolic BP 116 100 108  Diastolic BP 78 60 72  Wt. (Lbs) 199 201 203.2  BMI 33.63 33.97 34.86    Physical Exam  Constitutional: She is oriented to person, place, and time. She appears well-developed. No distress.  HENT:  Oropharynx clear. Normal TM's bilaterally.    Neck: Neck supple.  Cardiovascular: Normal rate and regular rhythm.   Pulmonary/Chest: Effort normal and breath sounds normal.  Lymphadenopathy:    She has no cervical adenopathy.  Neurological: She is  alert and oriented to person, place, and time.  Psychiatric: She has a normal mood and affect.  Vitals reviewed.  Lab Results  Component Value Date   WBC 9.1 12/17/2014   HGB 12.9 12/17/2014   HCT 39.5 12/17/2014   PLT 338.0 12/17/2014   GLUCOSE 93 08/29/2016   CHOL 141 08/29/2016   TRIG 89.0 08/29/2016   HDL 40.70 08/29/2016   LDLCALC 82 08/29/2016   ALT 12 08/29/2016   AST 15 08/29/2016   NA 138 08/29/2016   K 4.0 08/29/2016   CL 102 08/29/2016   CREATININE 0.79 08/29/2016   BUN 9 08/29/2016   CO2 29 08/29/2016   TSH 2.18 08/29/2016   HGBA1C 5.4 05/22/2016    Assessment & Plan:   Problem List Items Addressed This Visit    Influenza-like illness    New acute problem. Flu negative. Discussed treatment options and possible tamiflu given the fact that rapid test are not particularly sensitive. Patient elected for supportive care. Over-the-counter Tylenol and Motrin as needed.        Follow-up: PRN  Everlene OtherJayce Emmah Bratcher DO [facility] [place]

## 2017-02-10 NOTE — Progress Notes (Signed)
Pre visit review using our clinic review tool, if applicable. No additional management support is needed unless otherwise documented below in the visit note. 

## 2017-02-10 NOTE — Patient Instructions (Signed)
Flu negative.  Supportive care - OTC Tylenol/motrin.  Take care  Dr. Adriana Simasook

## 2017-02-10 NOTE — Assessment & Plan Note (Signed)
New acute problem. Flu negative. Discussed treatment options and possible tamiflu given the fact that rapid test are not particularly sensitive. Patient elected for supportive care. Over-the-counter Tylenol and Motrin as needed.

## 2017-04-12 ENCOUNTER — Encounter: Payer: Self-pay | Admitting: Internal Medicine

## 2017-04-12 ENCOUNTER — Ambulatory Visit (INDEPENDENT_AMBULATORY_CARE_PROVIDER_SITE_OTHER): Payer: BLUE CROSS/BLUE SHIELD | Admitting: Internal Medicine

## 2017-04-12 VITALS — BP 106/70 | HR 67 | Temp 98.0°F | Wt 198.0 lb

## 2017-04-12 DIAGNOSIS — R110 Nausea: Secondary | ICD-10-CM | POA: Diagnosis not present

## 2017-04-12 DIAGNOSIS — R102 Pelvic and perineal pain: Secondary | ICD-10-CM | POA: Diagnosis not present

## 2017-04-12 NOTE — Progress Notes (Signed)
Subjective:    Patient ID: Allison JacobsonJulie Annette [name], female    DOB: [birth year], 41 y.o.   MRN: [id number]  HPI  Pt presents to the clinic today with c/o RLQ abdominal cramping. This started yesterday. She reports it felt like a cramp, came on all of a sudden, resolved spontaneously. The area just feels sore now. She does have some associated nausea but denies vomiting. She denies constipation, diarrhea or blood in her stool. She denies urinary or vaginal problems. She denies fever, chills or body aches. She has had an appendectomy, tubal ligation and uterine ablation. She has not tried anything OTC for this.  Review of Systems      Past Medical History:  Diagnosis Date  . Depression   . Neuropathy   . Thyroid disease     Current Outpatient Prescriptions  Medication Sig Dispense Refill  . clonazePAM (KLONOPIN) 0.5 MG tablet Take 1 tablet (0.5 mg total) by mouth daily as needed for anxiety. 30 tablet 0  . sertraline (ZOLOFT) 100 MG tablet Take 1 tablet (100 mg total) by mouth daily. 30 tablet 6  . tiZANidine (ZANAFLEX) 4 MG tablet TAKE 1 TABLET (4 MG TOTAL) BY MOUTH NIGHTLY. 30 tablet 2   No current facility-administered medications for this visit.     No Known Allergies  Family History  Problem Relation Age of Onset  . Thyroid disease Mother        thyroid removed at age 40  . Depression Mother   . Arthritis Father   . Hypertension Father   . Cancer Maternal Grandmother 80       breast  . Neuropathy Neg Hx   . Migraines Neg Hx     Social History   Social History  . Marital status: Married    Spouse name: N/A  . Number of children: N/A  . Years of education: N/A   Occupational History  . Not on file.   Social History Main Topics  . Smoking status: Former Smoker    Packs/day: 0.50    Years: 5.00    Types: Cigarettes    Quit date: 12/05/2007  . Smokeless tobacco: Never Used  . Alcohol use Yes     Comment: occ, 1-2 a week  . Drug use: No  . Sexual activity: Yes     Birth control/ protection: Surgical   Other Topics Concern  . Not on file   Social History Narrative  . No narrative on file     Constitutional: Denies fever, malaise, fatigue, headache or abrupt weight changes.  Gastrointestinal: Pt reports abdominal pain and nausea. Denies bloating, constipation, diarrhea or blood in the stool.  GU: Denies urgency, frequency, pain with urination, burning sensation, blood in urine, odor or discharge.  No other specific complaints in a complete review of systems (except as listed in HPI above).  Objective:   Physical Exam   BP 106/70   Pulse 67   Temp 98 F (36.7 C) (Oral)   Wt 198 lb (89.8 kg)   SpO2 98%   BMI 33.46 kg/m  Wt Readings from Last 3 Encounters:  04/12/17 198 lb (89.8 kg)  02/10/17 199 lb (90.3 kg)  09/05/16 201 lb (91.2 kg)    General: Appears her stated age, obese in NAD. Abdomen: Soft and tender in the RLQ. No masses noted. Negative Psoas. No CVA tenderness noted.   BMET    Component Value Date/Time   NA 138 08/29/2016 0856   K 4.0 08/29/2016 0856  CL 102 08/29/2016 0856   CO2 29 08/29/2016 0856   GLUCOSE 93 08/29/2016 0856   BUN 9 08/29/2016 0856   CREATININE 0.79 08/29/2016 0856   CALCIUM 9.3 08/29/2016 0856    Lipid Panel     Component Value Date/Time   CHOL 141 08/29/2016 0856   TRIG 89.0 08/29/2016 0856   HDL 40.70 08/29/2016 0856   CHOLHDL 3 08/29/2016 0856   VLDL 17.8 08/29/2016 0856   LDLCALC 82 08/29/2016 0856    CBC    Component Value Date/Time   WBC 9.1 12/17/2014 1418   RBC 4.96 12/17/2014 1418   HGB 12.9 12/17/2014 1418   HCT 39.5 12/17/2014 1418   PLT 338.0 12/17/2014 1418   MCV 79.6 12/17/2014 1418   MCHC 32.6 12/17/2014 1418   RDW 15.2 12/17/2014 1418    Hgb A1C Lab Results  Component Value Date   HGBA1C 5.4 05/22/2016           Assessment & Plan:   RLQ Pelvic Pain, Nausea:  Urinalysis: negative Urine HcG: negative Will obtain pelvic and transvaginal  ultrasound- see Shirlee Limerick on the way out to schedule A heating pad and Ibuprofen may be helpful  Will follow up after ultrasound, return precautions discussed. Nicki Reaper, NP

## 2017-04-12 NOTE — Patient Instructions (Signed)
Pelvic Pain, Female °Pelvic pain is pain in your lower belly (abdomen), below your belly button and between your hips. The pain may start suddenly (acute), keep coming back (recurring), or last a long time (chronic). Pelvic pain that lasts longer than six months is considered chronic. There are many causes of pelvic pain. Sometimes the cause of your pelvic pain is not known. °Follow these instructions at home: °· Take over-the-counter and prescription medicines only as told by your doctor. °· Rest as told by your doctor. °· Do not have sex it if hurts. °· Keep a journal of your pelvic pain. Write down: °¨ When the pain started. °¨ Where the pain is located. °¨ What seems to make the pain better or worse, such as food or your menstrual cycle. °¨ Any symptoms you have along with the pain. °· Keep all follow-up visits as told by your doctor. This is important. °Contact a doctor if: °· Medicine does not help your pain. °· Your pain comes back. °· You have new symptoms. °· You have unusual vaginal discharge or bleeding. °· You have a fever or chills. °· You are having a hard time pooping (constipation). °· You have blood in your pee (urine) or poop (stool). °· Your pee smells bad. °· You feel weak or lightheaded. °Get help right away if: °· You have sudden pain that is very bad. °· Your pain continues to get worse. °· You have very bad pain and also have any of the following symptoms: °¨ A fever. °¨ Feeling stick to your stomach (nausea). °¨ Throwing up (vomiting). °¨ Being very sweaty. °· You pass out (lose consciousness). °This information is not intended to replace advice given to you by your health care provider. Make sure you discuss any questions you have with your health care provider. °Document Released: 05/08/2008 Document Revised: 12/15/2015 Document Reviewed: 09/10/2015 °Elsevier Interactive Patient Education © 2017 [employer] ° °

## 2017-04-13 ENCOUNTER — Ambulatory Visit
Admission: RE | Admit: 2017-04-13 | Discharge: 2017-04-13 | Disposition: A | Discharge status: Home / Self Care | Payer: BLUE CROSS/BLUE SHIELD | Source: Ambulatory Visit | Attending: Internal Medicine | Admitting: Internal Medicine

## 2017-04-13 DIAGNOSIS — R102 Pelvic and perineal pain: Secondary | ICD-10-CM | POA: Diagnosis present

## 2017-04-17 ENCOUNTER — Other Ambulatory Visit: Payer: Self-pay | Admitting: Family Medicine

## 2017-04-27 ENCOUNTER — Ambulatory Visit: Payer: BLUE CROSS/BLUE SHIELD | Admitting: Family Medicine

## 2017-05-11 ENCOUNTER — Telehealth: Payer: Self-pay | Admitting: Family Medicine

## 2017-05-11 ENCOUNTER — Ambulatory Visit (INDEPENDENT_AMBULATORY_CARE_PROVIDER_SITE_OTHER): Payer: BLUE CROSS/BLUE SHIELD | Admitting: Physician Assistant

## 2017-05-11 ENCOUNTER — Telehealth: Payer: Self-pay

## 2017-05-11 VITALS — BP 109/75 | HR 69 | Resp 16 | Ht 64.5 in | Wt 199.2 lb

## 2017-05-11 DIAGNOSIS — F410 Panic disorder [episodic paroxysmal anxiety] without agoraphobia: Secondary | ICD-10-CM | POA: Diagnosis not present

## 2017-05-11 MED ORDER — QUETIAPINE FUMARATE 25 MG PO TABS: 12.5000 mg | ORAL_TABLET | Freq: Every day | ORAL | 0 refills | Status: DC

## 2017-05-11 MED ORDER — HYDROXYZINE HCL 10 MG PO TABS: 10.0000 mg | ORAL_TABLET | Freq: Three times a day (TID) | ORAL | 0 refills | Status: DC | PRN

## 2017-05-11 NOTE — Progress Notes (Signed)
05/11/2017 6:42 PM   DOB: 1976/02/08 / MRN: [id number]  SUBJECTIVE:  Allison [name] is a 41 y.o. female presenting for a "panic attack."  Tells me that today she was unable to get up off of the couch to help him vacuum the floors. She tells me that she felt that she could not move because "I did not know how." She associates HA, heart racing, and some mild SOB. This lasted 1/2 hour. She describes this as "watching myself from outside of myself."  This is not a new occurrence for her.  This is the first panic attack that she has had "in months."  She tried klonopin and tells me this did help her some. She may take a klonopin one time a month and uses this to prevent anxiety during family event. She tries to preoccupy her mind with books and games so she will not have to experience her anxiousness. She says that she is not sleeping well. She tries to go to sleep at 10 and falls asleep at 11.  She wakes up at 1:30 and is up for 2-3 hours.  She denies sadness today but does describes herself "as unfun." She is also complaining of crying spells at odd times. Says that her concentration is rather poor.   Denies a history of head injury. Has felt suicidal in the past but does NOT feel this way today. She has never had a plan of suicide. There is a stigma with mental illness as she describes herself "a [place] girl," and feels guilty that she has to take medication to control her depression and anxiety. She was promiscuous in high school.  She does not have a psychologist and is open to this. She has tried most SSRI's and feels that sertraline is not helping her greatly. She drinks maybe one drink daily and may have a glass of wine, spirits or beer but does not drink more than one a day.   She thinks she is worse now because she is selling a house and she has three kids that are about finish school. She has a history of Graves disease during between pregnancies and tells me that she took medication for  this and has not had a problem since. Her mother had her thyroid removed for "hypthyroidism."     She has No Known Allergies.   She  has a past medical history of Depression; Neuropathy; and Thyroid disease.    She  reports that she quit smoking about 9 years ago. Her smoking use included Cigarettes. She has a 2.50 pack-year smoking history. She has never used smokeless tobacco. She reports that she drinks alcohol. She reports that she does not use drugs. She  reports that she currently engages in sexual activity. She reports using the following method of birth control/protection: Surgical. The patient  has a past surgical history that includes Appendectomy and Tubal ligation.  Her family history includes Arthritis in her father; Cancer (age of onset: 15) in her maternal grandmother; Depression in her mother; Hypertension in her father; Thyroid disease in her mother.  Review of Systems  Neurological: Negative for dizziness.  Psychiatric/Behavioral: Negative for depression, hallucinations, memory loss, substance abuse and suicidal ideas. The patient is nervous/anxious and has insomnia.     The problem list and medications were reviewed and updated by myself where necessary and exist elsewhere in the encounter.   OBJECTIVE:  BP 109/75 (Cuff Size: Normal)   Pulse 69   Resp 16  Ht 5' 4.5" (1.638 m)   Wt 199 lb 3.2 oz (90.4 kg)   SpO2 98%   BMI 33.66 kg/m   Lab Results  Component Value Date   TSH 2.18 08/29/2016     Physical Exam  Constitutional: She is oriented to person, place, and time.  Cardiovascular: Normal rate.   Pulmonary/Chest: Effort normal.  Musculoskeletal: Normal range of motion.  Neurological: She is alert and oriented to person, place, and time. She has normal reflexes.        No results found for this or any previous visit (from the past 72 hour(s)).  No results found.  ASSESSMENT AND PLAN:  Raynelle FanningJulie was seen today for anxiety and depression.  Diagnoses  and all orders for this visit:  Severe anxiety with panic: MDQ borderline positive, and negative only because she she states these symptoms are mild. However, her HPI states these symptoms may be debilitating. Will try her on low dose Seroquel for now and advised she try to take this at its lowest dose and work up to 50 mg as needed nightly. She has some hydroxyzine to try as well.  Given that she may have cyclothymia I am getting her in with psychiatry. She will investigate counseling once she has seen the psychiatrist.  -     QUEtiapine (SEROQUEL) 25 MG tablet; Take 0.5-2 tablets (12.5-50 mg total) by mouth at bedtime. -     hydrOXYzine (ATARAX/VISTARIL) 10 MG tablet; Take 1 tablet (10 mg total) by mouth 3 (three) times daily as needed. -     Ambulatory referral to Psychiatry    The patient is advised to call or return to clinic if she does not see an improvement in symptoms, or to seek the care of the closest emergency department if she worsens with the above plan.   Deliah BostonMichael Clark, MHS, PA-C [facility] 05/11/2017 6:42 PM

## 2017-05-11 NOTE — Telephone Encounter (Signed)
Patient called states for a couple of weeks now she hasn't been sleeping and has little interest in doing things she's been crying a lot and has SOB, some chest discomfort, very anxious. She takes ZOLOFT at night and it is not helping, she has KLONOPIN PRN. Pt is with her husband and 2 kids, she denied suicidal thoughts. Spoke with Dr. Ermalene SearingBedsole and she [facility] and see in that helps, after an hour if no change she needs to be fully evaluated and go to ED/urgent care. Pt was notified of Dr. Daphine DeutscherBedsole's instructions, pt verbalized understanding.

## 2017-05-11 NOTE — Telephone Encounter (Signed)
Opened in error

## 2017-05-11 NOTE — Patient Instructions (Signed)
     IF you received an x-ray today, you will receive an invoice from [facility]. Please contact [facility] at [phone] with questions or concerns regarding your invoice.   IF you received labwork today, you will receive an invoice from [facility]. Please contact [facility] at [phone] with questions or concerns regarding your invoice.   Our billing staff will not be able to assist you with questions regarding bills from these companies.  You will be contacted with the lab results as soon as they are available. The fastest way to get your results is to activate your My Chart account. Instructions are located on the last page of this paperwork. If you have not heard from us regarding the results in 2 weeks, please contact this office.     

## 2017-05-11 NOTE — Telephone Encounter (Signed)
Patient called back and said the medication didn't help.  Patient is going to call [facility] to try and make an appointment.

## 2017-09-07 ENCOUNTER — Encounter: Payer: Self-pay | Admitting: Family Medicine

## 2017-09-07 ENCOUNTER — Ambulatory Visit (INDEPENDENT_AMBULATORY_CARE_PROVIDER_SITE_OTHER): Payer: BLUE CROSS/BLUE SHIELD | Admitting: Family Medicine

## 2017-09-07 VITALS — BP 114/68 | HR 76 | Temp 98.1°F | Ht 64.5 in | Wt 212.5 lb

## 2017-09-07 DIAGNOSIS — R600 Localized edema: Secondary | ICD-10-CM | POA: Diagnosis not present

## 2017-09-07 MED ORDER — FUROSEMIDE 20 MG PO TABS: 20.0000 mg | ORAL_TABLET | Freq: Every day | ORAL | 1 refills | Status: DC

## 2017-09-07 MED ORDER — POTASSIUM CHLORIDE ER 10 MEQ PO TBCR: 10.0000 meq | EXTENDED_RELEASE_TABLET | Freq: Every day | ORAL | 1 refills | Status: DC

## 2017-09-07 NOTE — Patient Instructions (Addendum)
Drink lots of water and avoid excess sodium  Elevate feet whenever you sit  Wear support hose when you can  Start lasix 20 mg once daily in the am  If you get dizzy / low blood pressure- stop it and let me know  Take one potassium pill daily  If you have muscle cramps please let me know   Follow up when you return

## 2017-09-07 NOTE — Progress Notes (Signed)
Subjective:    Patient ID: Allison [name], female    DOB: 08-06-1976, 41 y.o.   MRN: [id number]  HPI Here for swelling in feet and ankles   Has had hx of problems with edema for a long time but this is worse  Very swollen   Tried epsom salts  Soaks Ice/ cold water   No one knows the cause  Ortho/rheumatology   She took a diuretic years ago   Wears support stockings -did not help (over the counter) to the knee   Will be going to [place] soon and walk a lot  Leaves Monday am  Gone for a week      Wt Readings from Last 3 Encounters:  09/07/17 212 lb 8 oz (96.4 kg)  05/11/17 199 lb 3.2 oz (90.4 kg)  04/12/17 198 lb (89.8 kg)  35.91 kg/m   BP Readings from Last 3 Encounters:  09/07/17 114/68  05/11/17 109/75  04/12/17 106/70    Lab Results  Component Value Date   CREATININE 0.79 08/29/2016   BUN 9 08/29/2016   NA 138 08/29/2016   K 4.0 08/29/2016   CL 102 08/29/2016   CO2 29 08/29/2016   Lab Results  Component Value Date   TSH 2.18 08/29/2016    Has a h/o graves dz   Patient Active Problem List   Diagnosis Date Noted  . Pedal edema 09/07/2017  . Influenza-like illness 02/10/2017  . Class 1 obesity due to excess calories with body mass index (BMI) of 33.0 to 33.9 in adult 09/05/2016  . History of Graves' disease 09/05/2016  . Major depressive disorder, recurrent episode, moderate (HCC) 09/05/2016  . GAD (generalized anxiety disorder) 11/13/2014   Past Medical History:  Diagnosis Date  . Depression   . Neuropathy   . Thyroid disease    Past Surgical History:  Procedure Laterality Date  . APPENDECTOMY    . TUBAL LIGATION     Social History  Substance Use Topics  . Smoking status: Former Smoker    Packs/day: 0.50    Years: 5.00    Types: Cigarettes    Quit date: 12/05/2007  . Smokeless tobacco: Never Used  . Alcohol use Yes     Comment: occ, 1-2 a week   Family History  Problem Relation Age of Onset  . Thyroid disease Mother    thyroid removed at age 7  . Depression Mother   . Arthritis Father   . Hypertension Father   . Cancer Maternal Grandmother 80       breast  . Neuropathy Neg Hx   . Migraines Neg Hx    No Known Allergies Current Outpatient Prescriptions on File Prior to Visit  Medication Sig Dispense Refill  . clonazePAM (KLONOPIN) 0.5 MG tablet Take 1 tablet (0.5 mg total) by mouth daily as needed for anxiety. 30 tablet 0   No current facility-administered medications on file prior to visit.     Review of Systems  Constitutional: Negative for activity change, appetite change, fatigue, fever and unexpected weight change.  HENT: Negative for congestion, ear pain, rhinorrhea, sinus pressure and sore throat.   Eyes: Negative for pain, redness and visual disturbance.  Respiratory: Negative for cough, shortness of breath and wheezing.   Cardiovascular: Positive for leg swelling. Negative for chest pain and palpitations.  Gastrointestinal: Negative for abdominal pain, blood in stool, constipation and diarrhea.  Endocrine: Negative for polydipsia and polyuria.  Genitourinary: Negative for dysuria, frequency and urgency.  Musculoskeletal: Negative  for arthralgias, back pain and myalgias.  Skin: Negative for pallor and rash.  Allergic/Immunologic: Negative for environmental allergies.  Neurological: Negative for dizziness, syncope and headaches.  Hematological: Negative for adenopathy. Does not bruise/bleed easily.  Psychiatric/Behavioral: Negative for decreased concentration and dysphoric mood. The patient is not nervous/anxious.        Objective:   Physical Exam  Constitutional: She appears well-developed and well-nourished. No distress.  obese and well appearing   HENT:  Head: Normocephalic and atraumatic.  Mouth/Throat: Oropharynx is clear and moist.  Eyes: Pupils are equal, round, and reactive to light. Conjunctivae and EOM are normal.  Neck: Normal range of motion. Neck supple. No JVD present.  Carotid bruit is not present. No thyromegaly present.  Cardiovascular: Normal rate, regular rhythm, normal heart sounds and intact distal pulses.  Exam reveals no gallop.   Nl distal pulses No varicosities   Pulmonary/Chest: Effort normal and breath sounds normal. No respiratory distress. She has no wheezes. She has no rales.  No crackles  Abdominal: Soft. Bowel sounds are normal. She exhibits no distension, no abdominal bruit and no mass. There is no tenderness.  Musculoskeletal: She exhibits edema.  One plus edema to shin bilaterally  Lymphadenopathy:    She has no cervical adenopathy.  Neurological: She is alert. She has normal reflexes.  Skin: Skin is warm and dry. No rash noted. No pallor.  Psychiatric: She has a normal mood and affect.          Assessment & Plan:   Problem List Items Addressed This Visit      Other   Pedal edema    Acute on chronic  No cardiac symptoms  Medication may play a role as well as obesity Disc sodium intake/DASH eating plan  supp hose to the knee Trial of lasix 20 mg daily  Need to move legs on plane for upcoming trip  F/u with labs in 7-10 d when she returns

## 2017-09-09 NOTE — Assessment & Plan Note (Signed)
Acute on chronic  No cardiac symptoms  Medication may play a role as well as obesity Disc sodium intake/DASH eating plan  supp hose to the knee Trial of lasix 20 mg daily  Need to move legs on plane for upcoming trip  F/u with labs in 7-10 d when she returns

## 2017-09-13 ENCOUNTER — Encounter: Payer: Self-pay | Admitting: Family Medicine

## 2017-09-17 ENCOUNTER — Ambulatory Visit (INDEPENDENT_AMBULATORY_CARE_PROVIDER_SITE_OTHER): Payer: BLUE CROSS/BLUE SHIELD | Admitting: Family Medicine

## 2017-09-17 ENCOUNTER — Encounter: Payer: Self-pay | Admitting: Family Medicine

## 2017-09-17 VITALS — BP 98/60 | HR 80 | Temp 98.6°F | Ht 64.5 in | Wt 216.0 lb

## 2017-09-17 DIAGNOSIS — R600 Localized edema: Secondary | ICD-10-CM

## 2017-09-17 LAB — POC URINALSYSI DIPSTICK (AUTOMATED)
Bilirubin, UA: NEGATIVE
GLUCOSE UA: NEGATIVE
Ketones, UA: NEGATIVE
Leukocytes, UA: NEGATIVE
Nitrite, UA: NEGATIVE
PROTEIN UA: NEGATIVE
RBC UA: NEGATIVE
Spec Grav, UA: 1.015 (ref 1.010–1.025)
UROBILINOGEN UA: 0.2 U/dL
pH, UA: 6 (ref 5.0–8.0)

## 2017-09-17 NOTE — Assessment & Plan Note (Signed)
Acute on chronic -worsened after travel in sub optimal diet and much walking  Bilateral and symmetric  On several medications that could cause swelling  Some venous stasis dermatitis -improved Disc low sodium diet  Leg elevation/supp hose/lasix  Lab today for renal/hepatic fxn/ cbc/tsh and bnp (though no red flags for cardiac cause)  Also ua - to r/o nephrotic syndrome  Adj K if needed for lasix  If nl labs and no imp - consider px supp hose/ poss vasc consult echo or other imaging

## 2017-09-17 NOTE — Patient Instructions (Addendum)
Labs today and urinalysis   Continue lasix for now  Avoid sodium/drink lots of water  Support stockings - when you can  Elevate feet when sitting when you can   We will make a plan from there   Check in with your psychiatrist about medicines - ? Side effects   Try to get by without meloxicam for now - it can make you retain fluid

## 2017-09-17 NOTE — Progress Notes (Signed)
Subjective:    Patient ID: Allison [name], female    DOB: 1976/02/13, 41 y.o.   MRN: [id number]  HPI  Here for f/u of pedal edema   Px lasix at last visit (needs labs)  Micah Flesher on trip to [place]-on feet/god worse as expected  Walked 7-11 miles per day   Got home yesterday  Improved a bit today   Now has a rash on legs - was raw and stinging  Had to buy new shoes and worse support stockings  Elevated/ used ice   Rash is slt improved today   ? If rxn to to her psych meds- sees Dr Maryruth Bun next week   Hands are a little swollen  Hard to bend   Wt Readings from Last 3 Encounters:  09/17/17 216 lb (98 kg)  09/07/17 212 lb 8 oz (96.4 kg)  05/11/17 199 lb 3.2 oz (90.4 kg)    BP Readings from Last 3 Encounters:  09/17/17 98/60  09/07/17 114/68  05/11/17 109/75   Pulse Readings from Last 3 Encounters:  09/17/17 80  09/07/17 76  05/11/17 69    Takes nsaid (mobic) Also depakote and zyprexa   Mood is labile  Still seeing counselor also   No cramps with lasix   Patient Active Problem List   Diagnosis Date Noted  . Pedal edema 09/07/2017  . Influenza-like illness 02/10/2017  . Class 1 obesity due to excess calories with body mass index (BMI) of 33.0 to 33.9 in adult 09/05/2016  . History of Graves' disease 09/05/2016  . Major depressive disorder, recurrent episode, moderate (HCC) 09/05/2016  . GAD (generalized anxiety disorder) 11/13/2014   Past Medical History:  Diagnosis Date  . Depression   . Neuropathy   . Thyroid disease    Past Surgical History:  Procedure Laterality Date  . APPENDECTOMY    . TUBAL LIGATION     Social History  Substance Use Topics  . Smoking status: Former Smoker    Packs/day: 0.50    Years: 5.00    Types: Cigarettes    Quit date: 12/05/2007  . Smokeless tobacco: Never Used  . Alcohol use Yes     Comment: occ, 1-2 a week   Family History  Problem Relation Age of Onset  . Thyroid disease Mother        thyroid removed at age 85    . Depression Mother   . Arthritis Father   . Hypertension Father   . Cancer Maternal Grandmother 80       breast  . Neuropathy Neg Hx   . Migraines Neg Hx    No Known Allergies Current Outpatient Prescriptions on File Prior to Visit  Medication Sig Dispense Refill  . clonazePAM (KLONOPIN) 0.5 MG tablet Take 1 tablet (0.5 mg total) by mouth daily as needed for anxiety. 30 tablet 0  . divalproex (DEPAKOTE ER) 500 MG 24 hr tablet Take 2 tablets by mouth at bedtime.  2  . furosemide (LASIX) 20 MG tablet Take 1 tablet (20 mg total) by mouth daily. 30 tablet 1  . meloxicam (MOBIC) 15 MG tablet Take 1 tablet by mouth daily.  2  . OLANZapine (ZYPREXA) 10 MG tablet Take 1 tablet by mouth at bedtime.  0  . potassium chloride (K-DUR) 10 MEQ tablet Take 1 tablet (10 mEq total) by mouth daily. 30 tablet 1   No current facility-administered medications on file prior to visit.      Review of Systems  Constitutional: Negative  for activity change, appetite change, fatigue, fever and unexpected weight change.  HENT: Positive for postnasal drip and sneezing. Negative for congestion, ear pain, rhinorrhea, sinus pressure and sore throat.   Eyes: Negative for pain, discharge, redness and visual disturbance.  Respiratory: Negative for cough, shortness of breath, wheezing and stridor.   Cardiovascular: Positive for leg swelling. Negative for chest pain and palpitations.  Gastrointestinal: Negative for abdominal pain, blood in stool, constipation, diarrhea, nausea and vomiting.  Endocrine: Negative for polydipsia and polyuria.  Genitourinary: Negative for dysuria, frequency, hematuria and urgency.  Musculoskeletal: Negative for arthralgias, back pain and myalgias.  Skin: Negative for pallor and rash.  Allergic/Immunologic: Negative for environmental allergies.  Neurological: Negative for dizziness, syncope, weakness, light-headedness and headaches.  Hematological: Negative for adenopathy. Does not  bruise/bleed easily.  Psychiatric/Behavioral: Negative for confusion, decreased concentration and dysphoric mood. The patient is not nervous/anxious.        Objective:   Physical Exam  Constitutional: She appears well-developed and well-nourished. No distress.  obese and well appearing    HENT:  Head: Normocephalic and atraumatic.  Mouth/Throat: Oropharynx is clear and moist.  Eyes: Pupils are equal, round, and reactive to light. Conjunctivae and EOM are normal.  Neck: Normal range of motion. Neck supple. No JVD present. Carotid bruit is not present. No thyromegaly present.  Cardiovascular: Normal rate, regular rhythm, normal heart sounds and intact distal pulses.  Exam reveals no gallop.   Pulmonary/Chest: Effort normal and breath sounds normal. No respiratory distress. She has no wheezes. She has no rales.  No crackles  Abdominal: Soft. Bowel sounds are normal. She exhibits no distension, no abdominal bruit and no mass. There is no tenderness.  Musculoskeletal: She exhibits edema. She exhibits no tenderness or deformity.  Plus one pedal edema to knee  Some pitting  Skin changes consistent with venous stasis dermatitis  Nl pulses No large varicosities    Lymphadenopathy:    She has no cervical adenopathy.  Neurological: She is alert. She has normal reflexes. She exhibits normal muscle tone. Coordination normal.  Skin: Skin is warm and dry. No rash noted. No pallor.  Some areas of redness with scale over ankles consistent with venous stasis dermatitis   Psychiatric: She has a normal mood and affect.          Assessment & Plan:   Problem List Items Addressed This Visit      Other   Pedal edema - Primary    Acute on chronic -worsened after travel in sub optimal diet and much walking  Bilateral and symmetric  On several medications that could cause swelling  Some venous stasis dermatitis -improved Disc low sodium diet  Leg elevation/supp hose/lasix  Lab today for  renal/hepatic fxn/ cbc/tsh and bnp (though no red flags for cardiac cause)  Also ua - to r/o nephrotic syndrome  Adj K if needed for lasix  If nl labs and no imp - consider px supp hose/ poss vasc consult echo or other imaging       Relevant Orders   CBC with Differential/Platelet   Comprehensive metabolic panel   TSH   T4, free   Brain natriuretic peptide   POCT Urinalysis Dipstick (Automated) (Completed)

## 2017-09-18 ENCOUNTER — Encounter: Payer: Self-pay | Admitting: Family Medicine

## 2017-09-18 LAB — CBC WITH DIFFERENTIAL/PLATELET
Basophils Absolute: 0.1 10*3/uL (ref 0.0–0.1)
Basophils Relative: 1.2 % (ref 0.0–3.0)
EOS ABS: 0.4 10*3/uL (ref 0.0–0.7)
EOS PCT: 4.4 % (ref 0.0–5.0)
HEMATOCRIT: 37.6 % (ref 36.0–46.0)
HEMOGLOBIN: 12.9 g/dL (ref 12.0–15.0)
LYMPHS PCT: 32.7 % (ref 12.0–46.0)
Lymphs Abs: 2.8 10*3/uL (ref 0.7–4.0)
MCHC: 34.4 g/dL (ref 30.0–36.0)
MCV: 91.1 fl (ref 78.0–100.0)
MONO ABS: 0.7 10*3/uL (ref 0.1–1.0)
Monocytes Relative: 8 % (ref 3.0–12.0)
Neutro Abs: 4.6 10*3/uL (ref 1.4–7.7)
Neutrophils Relative %: 53.7 % (ref 43.0–77.0)
Platelets: 224 10*3/uL (ref 150.0–400.0)
RBC: 4.13 Mil/uL (ref 3.87–5.11)
RDW: 13.6 % (ref 11.5–15.5)
WBC: 8.5 10*3/uL (ref 4.0–10.5)

## 2017-09-18 LAB — COMPREHENSIVE METABOLIC PANEL
ALK PHOS: 66 U/L (ref 39–117)
ALT: 15 U/L (ref 0–35)
AST: 19 U/L (ref 0–37)
Albumin: 3.8 g/dL (ref 3.5–5.2)
BILIRUBIN TOTAL: 0.3 mg/dL (ref 0.2–1.2)
BUN: 18 mg/dL (ref 6–23)
CALCIUM: 9.4 mg/dL (ref 8.4–10.5)
CO2: 27 mEq/L (ref 19–32)
CREATININE: 1.13 mg/dL (ref 0.40–1.20)
Chloride: 101 mEq/L (ref 96–112)
GFR: 56.73 mL/min — ABNORMAL LOW (ref >60.00)
Glucose, Bld: 84 mg/dL (ref 70–99)
Potassium: 4.4 mEq/L (ref 3.5–5.1)
SODIUM: 141 meq/L (ref 135–145)
TOTAL PROTEIN: 7.1 g/dL (ref 6.0–8.3)

## 2017-09-18 LAB — BRAIN NATRIURETIC PEPTIDE: Pro B Natriuretic peptide (BNP): 40 pg/mL (ref 0.0–100.0)

## 2017-09-18 LAB — T4, FREE: Free T4: 0.72 ng/dL (ref 0.60–1.60)

## 2017-09-18 LAB — TSH: TSH: 2.53 u[IU]/mL (ref 0.35–4.50)

## 2017-10-07 ENCOUNTER — Encounter: Payer: Self-pay | Admitting: Family Medicine

## 2017-10-10 ENCOUNTER — Telehealth: Payer: Self-pay | Admitting: *Deleted

## 2017-10-10 DIAGNOSIS — R600 Localized edema: Secondary | ICD-10-CM

## 2017-10-10 NOTE — Telephone Encounter (Signed)
Per your prev mychart messages with pt said she is okay with a vascular appt. When you are able please put in the referral

## 2017-10-14 NOTE — Telephone Encounter (Signed)
Referral done Will route to [facility]  

## 2017-10-30 ENCOUNTER — Encounter (INDEPENDENT_AMBULATORY_CARE_PROVIDER_SITE_OTHER): Payer: Self-pay | Admitting: Vascular Surgery

## 2017-10-30 ENCOUNTER — Ambulatory Visit (INDEPENDENT_AMBULATORY_CARE_PROVIDER_SITE_OTHER): Payer: BLUE CROSS/BLUE SHIELD | Admitting: Vascular Surgery

## 2017-10-30 VITALS — BP 107/69 | HR 66 | Resp 17 | Ht 65.0 in | Wt 216.0 lb

## 2017-10-30 DIAGNOSIS — Z6833 Body mass index (BMI) 33.0-33.9, adult: Secondary | ICD-10-CM | POA: Diagnosis not present

## 2017-10-30 DIAGNOSIS — E6609 Other obesity due to excess calories: Secondary | ICD-10-CM

## 2017-10-30 DIAGNOSIS — R600 Localized edema: Secondary | ICD-10-CM

## 2017-10-30 NOTE — Progress Notes (Signed)
Subjective:    Patient ID: Allison JacobsonJulie Annette [name], female    DOB: 1976-03-19, [age] y.o.   MRN: [id number] Chief Complaint  Patient presents with  . New Patient (Initial Visit)    Pedal edema   Presents as a new patient referred by Dr. Milinda Antisower for evaluation of bilateral lower extremity edema.  The patient endorses a history of experiencing swelling to her lower legs for many years.  She has noticed a significant increase in her lower extremity edema since October of this year.  The patient denies any recent trauma or surgery to her lower extremity.  The patient denies any DVT history.  The patient denies any claudication-like symptoms, rest pain or ulceration to the lower extremity.  The patient notes her edema is associated with discomfort.  This discomfort has progressed to the point she is unable to function on a daily basis.  Her symptoms have become lifestyle limiting.  The patient denies any fever, nausea or vomiting.   Review of Systems  Constitutional: Negative.   HENT: Negative.   Eyes: Negative.   Respiratory: Negative.   Cardiovascular: Positive for leg swelling.  Gastrointestinal: Negative.   Endocrine: Negative.   Genitourinary: Negative.   Musculoskeletal: Negative.   Skin: Negative.   Allergic/Immunologic: Negative.   Neurological: Negative.   Hematological: Negative.   Psychiatric/Behavioral: Negative.       Objective:   Physical Exam  Constitutional: She is oriented to person, place, and time. She appears well-developed and well-nourished. No distress.  HENT:  Head: Normocephalic and atraumatic.  Eyes: Conjunctivae are normal. Pupils are equal, round, and reactive to light.  Neck: Normal range of motion.  Cardiovascular: Normal rate, regular rhythm, normal heart sounds and intact distal pulses.  Pulses:      Radial pulses are 2+ on the right side, and 2+ on the left side.  Hard to palpate pedal pulses however the patient's bilateral feet are warm  Pulmonary/Chest:  Effort normal and breath sounds normal.  Musculoskeletal: Normal range of motion. She exhibits edema (Mild to moderate 1+ pitting edema noted bilaterally).  Neurological: She is alert and oriented to person, place, and time.  Skin: She is not diaphoretic.  There is no stasis dermatitis.  There is no cellulitis.  Skin is intact.  Psychiatric: She has a normal mood and affect. Her behavior is normal. Judgment and thought content normal.  Vitals reviewed.  BP 107/69 (BP Location: Right Arm, Patient Position: Sitting)   Pulse 66   Resp 17   Ht 5\' 5"  (1.651 m)   Wt 216 lb (98 kg)   BMI 35.94 kg/m   Past Medical History:  Diagnosis Date  . Depression   . Neuropathy   . Thyroid disease    Social History   Socioeconomic History  . Marital status: Married    Spouse name: Not on file  . Number of children: Not on file  . Years of education: Not on file  . Highest education level: Not on file  Social Needs  . Financial resource strain: Not on file  . Food insecurity - worry: Not on file  . Food insecurity - inability: Not on file  . Transportation needs - medical: Not on file  . Transportation needs - non-medical: Not on file  Occupational History  . Not on file  Tobacco Use  . Smoking status: Former Smoker    Packs/day: 0.50    Years: 5.00    Pack years: 2.50    Types: Cigarettes  Last attempt to quit: 12/05/2007    Years since quitting: 9.9  . Smokeless tobacco: Never Used  Substance and Sexual Activity  . Alcohol use: Yes    Comment: occ, 1-2 a week  . Drug use: No  . Sexual activity: Yes    Birth control/protection: Surgical  Other Topics Concern  . Not on file  Social History Narrative  . Not on file   Past Surgical History:  Procedure Laterality Date  . APPENDECTOMY    . TUBAL LIGATION     Family History  Problem Relation Age of Onset  . Thyroid disease Mother        thyroid removed at age 47  . Depression Mother   . Arthritis Father   . Hypertension  Father   . Cancer Maternal Grandmother 80       breast  . Neuropathy Neg Hx   . Migraines Neg Hx    No Known Allergies     Assessment & Plan:  Presents as a new patient referred by Dr. Milinda Antis for evaluation of bilateral lower extremity edema.  The patient endorses a history of experiencing swelling to her lower legs for many years.  She has noticed a significant increase in her lower extremity edema since October of this year.  The patient denies any recent trauma or surgery to her lower extremity.  The patient denies any DVT history.  The patient denies any claudication-like symptoms, rest pain or ulceration to the lower extremity.  The patient notes her edema is associated with discomfort.  This discomfort has progressed to the point she is unable to function on a daily basis.  Her symptoms have become lifestyle limiting.  The patient denies any fever, nausea or vomiting.  1. Pedal edema - New The patient was encouraged to wear graduated compression stockings (20-30 mmHg) on a daily basis. The patient was instructed to begin wearing the stockings first thing in the morning and removing them in the evening. The patient was instructed specifically not to sleep in the stockings. Prescription given. In addition, behavioral modification including elevation during the day will be initiated. Anti-inflammatories for pain. The patient will follow up in one month to asses conservative management.  Information on chronic venous insufficiency and compression stockings was given to the patient. The patient was instructed to call the office in the interim if any worsening edema or ulcerations to the legs, feet or toes occurs. The patient expresses their understanding.  - VAS [place] LOWER EXTREMITY VENOUS REFLUX; Future  2. Class 1 obesity due to excess calories without serious comorbidity with body mass index (BMI) of 33.0 to 33.9 in adult - Stable Contributing factor to the patient's lower extremity  edema.  Current Outpatient Medications on File Prior to Visit  Medication Sig Dispense Refill  . Asenapine Maleate (SAPHRIS) 10 MG SUBL Saphris 10 mg sublingual tablet  Place 1 tablet twice a day by sublingual route.    . clonazePAM (KLONOPIN) 0.5 MG tablet Take 1 tablet (0.5 mg total) by mouth daily as needed for anxiety. 30 tablet 0  . divalproex (DEPAKOTE ER) 500 MG 24 hr tablet Take 2 tablets by mouth at bedtime.  2  . furosemide (LASIX) 20 MG tablet Take 1 tablet (20 mg total) by mouth daily. (Patient not taking: Reported on 10/30/2017) 30 tablet 1  . meloxicam (MOBIC) 15 MG tablet Take 1 tablet by mouth daily.  2  . OLANZapine (ZYPREXA) 10 MG tablet Take 1 tablet by mouth at bedtime.  0  . potassium chloride (K-DUR) 10 MEQ tablet Take 1 tablet (10 mEq total) by mouth daily. (Patient not taking: Reported on 10/30/2017) 30 tablet 1  . sertraline (ZOLOFT) 100 MG tablet Take by mouth.     No current facility-administered medications on file prior to visit.    There are no Patient Instructions on file for this visit. No Follow-up on file.  Braydon Kullman A [employer] Arentz, PA-C

## 2017-11-07 ENCOUNTER — Ambulatory Visit (INDEPENDENT_AMBULATORY_CARE_PROVIDER_SITE_OTHER): Payer: Managed Care, Other (non HMO)

## 2017-11-07 DIAGNOSIS — Z23 Encounter for immunization: Secondary | ICD-10-CM | POA: Diagnosis not present

## 2017-11-08 ENCOUNTER — Ambulatory Visit: Payer: BLUE CROSS/BLUE SHIELD

## 2017-12-11 ENCOUNTER — Ambulatory Visit: Payer: Managed Care, Other (non HMO) | Admitting: Nurse Practitioner

## 2017-12-11 ENCOUNTER — Encounter: Payer: Self-pay | Admitting: Nurse Practitioner

## 2017-12-11 VITALS — BP 110/74 | HR 71 | Temp 97.8°F | Ht 65.0 in | Wt 206.0 lb

## 2017-12-11 DIAGNOSIS — R1012 Left upper quadrant pain: Secondary | ICD-10-CM | POA: Diagnosis not present

## 2017-12-11 NOTE — Patient Instructions (Addendum)
Normal lab results. No additional work up needed. Use naproxen of tylenol for pain.

## 2017-12-11 NOTE — Progress Notes (Signed)
Subjective:  Patient ID: Allison [name], female    DOB: November 09, 1976  Age: 42 y.o. MRN: [id number]  CC: Abdominal Pain (pain on left side abdominal--pull muscle? going on for 1 mo--)   Abdominal Pain  This is a new problem. The current episode started 1 to 4 weeks ago. The onset quality is gradual. The problem occurs constantly. The problem has been gradually worsening. The pain is located in the LUQ. The quality of the pain is colicky and dull. Pertinent negatives include no anorexia, arthralgias, belching, constipation, diarrhea, dysuria, fever, flatus, frequency, hematochezia, hematuria, melena, myalgias, nausea, vomiting or weight loss. Nothing aggravates the pain. The pain is relieved by nothing. She has tried nothing for the symptoms. Prior diagnostic workup includes ultrasound.    Outpatient Medications Prior to Visit  Medication Sig Dispense Refill  . clonazePAM (KLONOPIN) 0.5 MG tablet Take 1 tablet (0.5 mg total) by mouth daily as needed for anxiety. 30 tablet 0  . divalproex (DEPAKOTE ER) 500 MG 24 hr tablet Take 2 tablets by mouth at bedtime.  2  . Asenapine Maleate (SAPHRIS) 10 MG SUBL Saphris 10 mg sublingual tablet  Place 1 tablet twice a day by sublingual route.    . furosemide (LASIX) 20 MG tablet Take 1 tablet (20 mg total) by mouth daily. (Patient not taking: Reported on 10/30/2017) 30 tablet 1  . meloxicam (MOBIC) 15 MG tablet Take 1 tablet by mouth daily.  2  . OLANZapine (ZYPREXA) 10 MG tablet Take 1 tablet by mouth at bedtime.  0  . potassium chloride (K-DUR) 10 MEQ tablet Take 1 tablet (10 mEq total) by mouth daily. (Patient not taking: Reported on 10/30/2017) 30 tablet 1  . SAPHRIS 5 MG SUBL 24 hr tablet     . sertraline (ZOLOFT) 100 MG tablet Take by mouth.     No facility-administered medications prior to visit.     ROS See HPI  Objective:  BP 110/74   Pulse 71   Temp 97.8 F (36.6 C) (Oral)   Ht 5\' 5"  (1.651 m)   Wt 206 lb (93.4 kg)   SpO2 98%    BMI 34.28 kg/m   BP Readings from Last 3 Encounters:  12/12/17 100/63  12/11/17 110/74  10/30/17 107/69    Wt Readings from Last 3 Encounters:  12/12/17 219 lb (99.3 kg)  12/11/17 206 lb (93.4 kg)  10/30/17 216 lb (98 kg)    Physical Exam  Constitutional: She is oriented to person, place, and time. No distress.  Cardiovascular: Normal rate, regular rhythm and normal heart sounds.  Pulmonary/Chest: Effort normal and breath sounds normal.  Abdominal: Soft. Bowel sounds are normal. She exhibits no distension. There is no tenderness. There is no guarding.  No flank pain  Neurological: She is alert and oriented to person, place, and time.  Skin: Skin is warm and dry. No rash noted.  Psychiatric: She has a normal mood and affect. Her behavior is normal.  Vitals reviewed.   Lab Results  Component Value Date   WBC 8.5 09/17/2017   HGB 12.9 09/17/2017   HCT 37.6 09/17/2017   PLT 224.0 09/17/2017   GLUCOSE 84 09/17/2017   CHOL 141 08/29/2016   TRIG 89.0 08/29/2016   HDL 40.70 08/29/2016   LDLCALC 82 08/29/2016   ALT 18 12/11/2017   AST 20 12/11/2017   NA 141 09/17/2017   K 4.4 09/17/2017   CL 101 09/17/2017   CREATININE 1.13 09/17/2017   BUN 18 09/17/2017  CO2 27 09/17/2017   TSH 2.53 09/17/2017   HGBA1C 5.4 05/22/2016    US Transvaginal Non-ob  Result Date: 04/13/2017 CLINICAL DATA:  Right lower quadrant pain for 2 days. History of a uterine ablation in January 2018. EXAM: TRANSABDOMINAL AND TRANSVAGINAL ULTRASOUND OF PELVIS TECHNIQUE: Both transabdominal and transvaginal ultrasound examinations of the pelvis were performed. Transabdominal technique was performed for global imaging of the pelvis including uterus, ovaries, adnexal regions, and pelvic cul-de-sac. It was necessary to proceed with endovaginal exam following the transabdominal exam to visualize the uterus, endometrium and ovaries to better advantage. COMPARISON:  None FINDINGS: Uterus Measurements: 8.6 x 3.4 x  4.5 cm. No fibroids or other mass visualized. Endometrium Thickness: 8 mm. Sliver of fluid noted in the endometrial canal. No endometrial masses. Right ovary Measurements: 2.6 x 2.6 x 2.4 cm. Dominant simple appearing follicular cyst measuring 19 mm. Ovary otherwise unremarkable. No adnexal masses. Left ovary Measurements: 3.6 x 1.8 x 3.6 cm. Normal appearance/no adnexal mass. Other findings No abnormal free fluid. IMPRESSION: 1. Normal exam. No findings to account for right lower quadrant abdominal pain. Electronically Signed   By: Amie Portland M.D.   On: 04/13/2017 16:35   US Pelvis Complete  Result Date: 04/13/2017 CLINICAL DATA:  Right lower quadrant pain for 2 days. History of a uterine ablation in January 2018. EXAM: TRANSABDOMINAL AND TRANSVAGINAL ULTRASOUND OF PELVIS TECHNIQUE: Both transabdominal and transvaginal ultrasound examinations of the pelvis were performed. Transabdominal technique was performed for global imaging of the pelvis including uterus, ovaries, adnexal regions, and pelvic cul-de-sac. It was necessary to proceed with endovaginal exam following the transabdominal exam to visualize the uterus, endometrium and ovaries to better advantage. COMPARISON:  None FINDINGS: Uterus Measurements: 8.6 x 3.4 x 4.5 cm. No fibroids or other mass visualized. Endometrium Thickness: 8 mm. Sliver of fluid noted in the endometrial canal. No endometrial masses. Right ovary Measurements: 2.6 x 2.6 x 2.4 cm. Dominant simple appearing follicular cyst measuring 19 mm. Ovary otherwise unremarkable. No adnexal masses. Left ovary Measurements: 3.6 x 1.8 x 3.6 cm. Normal appearance/no adnexal mass. Other findings No abnormal free fluid. IMPRESSION: 1. Normal exam. No findings to account for right lower quadrant abdominal pain. Electronically Signed   By: Amie Portland M.D.   On: 04/13/2017 16:35    Assessment & Plan:   [name] was seen today for abdominal pain.  Diagnoses and all orders for this visit:  LUQ  abdominal pain -     Urinalysis w microscopic + reflex cultur -     Lipase -     Hepatic function panel  Other orders -     REFLEXIVE URINE CULTURE   I am having [name] maintain her clonazePAM, divalproex, meloxicam, OLANZapine, furosemide, potassium chloride, Asenapine Maleate, sertraline, and SAPHRIS.  No orders of the defined types were placed in this encounter.   Follow-up: Return if symptoms worsen or fail to improve.  Alysia Penna, NP

## 2017-12-12 ENCOUNTER — Encounter: Payer: Self-pay | Admitting: Nurse Practitioner

## 2017-12-12 ENCOUNTER — Ambulatory Visit (INDEPENDENT_AMBULATORY_CARE_PROVIDER_SITE_OTHER): Payer: BLUE CROSS/BLUE SHIELD | Admitting: Vascular Surgery

## 2017-12-12 ENCOUNTER — Encounter (INDEPENDENT_AMBULATORY_CARE_PROVIDER_SITE_OTHER): Payer: Self-pay | Admitting: Vascular Surgery

## 2017-12-12 ENCOUNTER — Ambulatory Visit (INDEPENDENT_AMBULATORY_CARE_PROVIDER_SITE_OTHER): Payer: Managed Care, Other (non HMO)

## 2017-12-12 VITALS — BP 100/63 | HR 62 | Resp 17 | Wt 219.0 lb

## 2017-12-12 DIAGNOSIS — F331 Major depressive disorder, recurrent, moderate: Secondary | ICD-10-CM | POA: Diagnosis not present

## 2017-12-12 DIAGNOSIS — R600 Localized edema: Secondary | ICD-10-CM | POA: Diagnosis not present

## 2017-12-12 DIAGNOSIS — I872 Venous insufficiency (chronic) (peripheral): Secondary | ICD-10-CM | POA: Diagnosis not present

## 2017-12-12 DIAGNOSIS — I890 Lymphedema, not elsewhere classified: Secondary | ICD-10-CM | POA: Diagnosis not present

## 2017-12-12 LAB — HEPATIC FUNCTION PANEL
AG Ratio: 1.4 (calc) (ref 1.0–2.5)
ALKALINE PHOSPHATASE (APISO): 75 U/L (ref 33–115)
ALT: 18 U/L (ref 6–29)
AST: 20 U/L (ref 10–30)
Albumin: 4.4 g/dL (ref 3.6–5.1)
BILIRUBIN DIRECT: 0.1 mg/dL (ref 0.0–0.2)
BILIRUBIN INDIRECT: 0.1 mg/dL — AB (ref 0.2–1.2)
Globulin: 3.1 g/dL (calc) (ref 1.9–3.7)
TOTAL PROTEIN: 7.5 g/dL (ref 6.1–8.1)
Total Bilirubin: 0.2 mg/dL (ref 0.2–1.2)

## 2017-12-12 LAB — URINALYSIS W MICROSCOPIC + REFLEX CULTURE
BACTERIA UA: NONE SEEN /HPF
Bilirubin Urine: NEGATIVE
Glucose, UA: NEGATIVE
HGB URINE DIPSTICK: NEGATIVE
Hyaline Cast: NONE SEEN /LPF
Ketones, ur: NEGATIVE
LEUKOCYTE ESTERASE: NEGATIVE
Nitrites, Initial: NEGATIVE
Protein, ur: NEGATIVE
RBC / HPF: NONE SEEN /HPF (ref 0–2)
Specific Gravity, Urine: 1.014 (ref 1.001–1.03)
WBC UA: NONE SEEN /HPF (ref 0–5)
pH: 6 (ref 5.0–8.0)

## 2017-12-12 LAB — LIPASE: LIPASE: 16 U/L (ref 7–60)

## 2017-12-12 LAB — NO CULTURE INDICATED

## 2017-12-12 NOTE — Progress Notes (Signed)
Subjective:    Patient ID: Allison [name], female    DOB: 06-19-1976, 42 y.o.   MRN: [id number] Chief Complaint  Patient presents with  . Follow-up    1 month bil ven reflux   Patient presents to review vascular studies.  The patient was last seen approximately 1 month ago for evaluation of bilateral lower extremity edema and discomfort.  Since her initial visit, the patient has been engaging in conservative therapy including wearing medical grade 1 compression stockings, elevating her legs heart level or higher and remaining active with minimal improvement in her symptoms requiring the use of over-the-counter anti-inflammatories.  The patient's symptoms have progressed to the point that she is unable to function on a daily basis.  Her symptoms have become lifestyle limiting.  The patient underwent a bilateral lower extremity venous reflux study which was notable for venous reflux in the left greater saphenous and small saphenous vein.  No reflux noted to the right lower extremity.  No deep vein thrombosis or superficial vein thrombophlebitis.  The patient denies any fever, nausea vomiting.   Review of Systems  Constitutional: Negative.   HENT: Negative.   Eyes: Negative.   Respiratory: Negative.   Cardiovascular: Positive for leg swelling.       Lower extremity pain  Gastrointestinal: Negative.   Endocrine: Negative.   Genitourinary: Negative.   Musculoskeletal: Negative.   Skin: Negative.   Allergic/Immunologic: Negative.   Neurological: Negative.   Hematological: Negative.   Psychiatric/Behavioral: Negative.       Objective:   Physical Exam  Constitutional: She is oriented to person, place, and time. She appears well-developed and well-nourished. No distress.  HENT:  Head: Normocephalic and atraumatic.  Eyes: Conjunctivae are normal. Pupils are equal, round, and reactive to light.  Neck: Normal range of motion.  Cardiovascular: Normal rate, regular rhythm, normal heart  sounds and intact distal pulses.  Pulses:      Radial pulses are 2+ on the right side, and 2+ on the left side.  Hard to palpate pedal pulses due to body habitus and edema  Pulmonary/Chest: Effort normal and breath sounds normal.  Musculoskeletal: Normal range of motion. She exhibits edema (Mild to moderate 1+ pitting edema noted bilaterally).  Neurological: She is alert and oriented to person, place, and time.  Skin: She is not diaphoretic.  There is no active cellulitis.  There is no skin changes.  There is no stasis dermatitis.  Psychiatric: She has a normal mood and affect. Her behavior is normal. Judgment and thought content normal.  Vitals reviewed.  BP 100/63 (BP Location: Right Arm)   Pulse 62   Resp 17   Wt 219 lb (99.3 kg)   BMI 36.44 kg/m   Past Medical History:  Diagnosis Date  . Depression   . Neuropathy   . Thyroid disease    Social History   Socioeconomic History  . Marital status: Married    Spouse name: Not on file  . Number of children: Not on file  . Years of education: Not on file  . Highest education level: Not on file  Social Needs  . Financial resource strain: Not on file  . Food insecurity - worry: Not on file  . Food insecurity - inability: Not on file  . Transportation needs - medical: Not on file  . Transportation needs - non-medical: Not on file  Occupational History  . Not on file  Tobacco Use  . Smoking status: Former Smoker  Packs/day: 0.50    Years: 5.00    Pack years: 2.50    Types: Cigarettes    Last attempt to quit: 12/05/2007    Years since quitting: 10.0  . Smokeless tobacco: Never Used  Substance and Sexual Activity  . Alcohol use: Yes    Comment: occ, 1-2 a week  . Drug use: No  . Sexual activity: Yes    Birth control/protection: Surgical  Other Topics Concern  . Not on file  Social History Narrative  . Not on file   Past Surgical History:  Procedure Laterality Date  . APPENDECTOMY    . TUBAL LIGATION     Family  History  Problem Relation Age of Onset  . Thyroid disease Mother        thyroid removed at age 64  . Depression Mother   . Arthritis Father   . Hypertension Father   . Cancer Maternal Grandmother 80       breast  . Neuropathy Neg Hx   . Migraines Neg Hx    No Known Allergies     Assessment & Plan:  Patient presents to review vascular studies.  The patient was last seen approximately 1 month ago for evaluation of bilateral lower extremity edema and discomfort.  Since her initial visit, the patient has been engaging in conservative therapy including wearing medical grade 1 compression stockings, elevating her legs heart level or higher and remaining active with minimal improvement in her symptoms requiring the use of over-the-counter anti-inflammatories.  The patient's symptoms have progressed to the point that she is unable to function on a daily basis.  Her symptoms have become lifestyle limiting.  The patient underwent a bilateral lower extremity venous reflux study which was notable for venous reflux in the left greater saphenous and small saphenous vein.  No reflux noted to the right lower extremity.  No deep vein thrombosis or superficial vein thrombophlebitis.  The patient denies any fever, nausea vomiting.  1. Chronic venous insufficiency - New Patient with venous reflux noted in the left great saphenous and small saphenous vein No reflux noted to the right lower extremity The patient has been engaging in conservative therapy including wearing medical grade 1 compression stockings, elevating her legs heart level or higher and remaining active with minimal improvement in her symptoms requiring the use of over-the-counter anti-inflammatories.  The patient will follow up in 2 months for a total of 3 months to assess her progress with conservative therapy  2. Lymphedema - New Despite conservative treatments including exercise, elevation and class I compression stockings the patient still  presents with stage I lymphedema The patient would greatly benefit from the added therapy of a lymphedema pump The patient will continue to engage in conservative therapy while applied to her insurance The patient is to follow-up in 2 months to assess her progress with conservative therapy and the lymphedema pump  3. Obesity - stable This is a contributing factor to the patient's lower extremity edema  Current Outpatient Medications on File Prior to Visit  Medication Sig Dispense Refill  . Asenapine Maleate (SAPHRIS) 10 MG SUBL Saphris 10 mg sublingual tablet  Place 1 tablet twice a day by sublingual route.    . clonazePAM (KLONOPIN) 0.5 MG tablet Take 1 tablet (0.5 mg total) by mouth daily as needed for anxiety. 30 tablet 0  . divalproex (DEPAKOTE ER) 500 MG 24 hr tablet Take 2 tablets by mouth at bedtime.  2  . meloxicam (MOBIC) 15 MG tablet  Take 1 tablet by mouth daily.  2  . OLANZapine (ZYPREXA) 10 MG tablet Take 1 tablet by mouth at bedtime.  0  . SAPHRIS 5 MG SUBL 24 hr tablet     . furosemide (LASIX) 20 MG tablet Take 1 tablet (20 mg total) by mouth daily. (Patient not taking: Reported on 10/30/2017) 30 tablet 1  . potassium chloride (K-DUR) 10 MEQ tablet Take 1 tablet (10 mEq total) by mouth daily. (Patient not taking: Reported on 10/30/2017) 30 tablet 1  . sertraline (ZOLOFT) 100 MG tablet Take by mouth.     No current facility-administered medications on file prior to visit.    There are no Patient Instructions on file for this visit. No Follow-up on file.  Alekhya Gravlin A Naheim Burgen, PA-C

## 2018-01-02 ENCOUNTER — Ambulatory Visit (INDEPENDENT_AMBULATORY_CARE_PROVIDER_SITE_OTHER): Payer: Managed Care, Other (non HMO) | Admitting: Family Medicine

## 2018-01-02 ENCOUNTER — Encounter: Payer: Self-pay | Admitting: Family Medicine

## 2018-01-02 VITALS — BP 116/64 | HR 68 | Temp 98.6°F | Ht 65.25 in | Wt 221.8 lb

## 2018-01-02 DIAGNOSIS — F411 Generalized anxiety disorder: Secondary | ICD-10-CM | POA: Diagnosis not present

## 2018-01-02 DIAGNOSIS — F39 Unspecified mood [affective] disorder: Secondary | ICD-10-CM

## 2018-01-02 DIAGNOSIS — Z8639 Personal history of other endocrine, nutritional and metabolic disease: Secondary | ICD-10-CM | POA: Diagnosis not present

## 2018-01-02 DIAGNOSIS — Z0000 Encounter for general adult medical examination without abnormal findings: Secondary | ICD-10-CM | POA: Diagnosis not present

## 2018-01-02 LAB — CBC WITH DIFFERENTIAL/PLATELET
BASOS ABS: 0.1 10*3/uL (ref 0.0–0.1)
BASOS PCT: 1 % (ref 0.0–3.0)
EOS PCT: 4.1 % (ref 0.0–5.0)
Eosinophils Absolute: 0.3 10*3/uL (ref 0.0–0.7)
HEMATOCRIT: 43.6 % (ref 36.0–46.0)
Hemoglobin: 14.6 g/dL (ref 12.0–15.0)
LYMPHS ABS: 2.7 10*3/uL (ref 0.7–4.0)
LYMPHS PCT: 36.3 % (ref 12.0–46.0)
MCHC: 33.6 g/dL (ref 30.0–36.0)
MCV: 91.6 fl (ref 78.0–100.0)
MONOS PCT: 7.5 % (ref 3.0–12.0)
Monocytes Absolute: 0.6 10*3/uL (ref 0.1–1.0)
NEUTROS ABS: 3.8 10*3/uL (ref 1.4–7.7)
Neutrophils Relative %: 51.1 % (ref 43.0–77.0)
PLATELETS: 217 10*3/uL (ref 150.0–400.0)
RBC: 4.76 Mil/uL (ref 3.87–5.11)
RDW: 12.3 % (ref 11.5–15.5)
WBC: 7.5 10*3/uL (ref 4.0–10.5)

## 2018-01-02 LAB — LIPID PANEL
CHOL/HDL RATIO: 5
Cholesterol: 184 mg/dL (ref 0–200)
HDL: 38.2 mg/dL — AB (ref >39.00)
LDL Cholesterol: 106 mg/dL — ABNORMAL HIGH (ref 0–99)
NonHDL: 146.02
TRIGLYCERIDES: 200 mg/dL — AB (ref 0.0–149.0)
VLDL: 40 mg/dL (ref 0.0–40.0)

## 2018-01-02 LAB — COMPREHENSIVE METABOLIC PANEL
ALK PHOS: 70 U/L (ref 39–117)
ALT: 16 U/L (ref 0–35)
AST: 16 U/L (ref 0–37)
Albumin: 4.2 g/dL (ref 3.5–5.2)
BILIRUBIN TOTAL: 0.5 mg/dL (ref 0.2–1.2)
BUN: 15 mg/dL (ref 6–23)
CALCIUM: 9.4 mg/dL (ref 8.4–10.5)
CO2: 26 meq/L (ref 19–32)
Chloride: 102 mEq/L (ref 96–112)
Creatinine, Ser: 0.8 mg/dL (ref 0.40–1.20)
GFR: 84.39 mL/min (ref >60.00)
Glucose, Bld: 87 mg/dL (ref 70–99)
POTASSIUM: 4.1 meq/L (ref 3.5–5.1)
Sodium: 138 mEq/L (ref 135–145)
Total Protein: 7.8 g/dL (ref 6.0–8.3)

## 2018-01-02 LAB — TSH: TSH: 2.36 u[IU]/mL (ref 0.35–4.50)

## 2018-01-02 NOTE — Progress Notes (Signed)
Subjective:   Patient ID: Allison JacobsonJulie Annette Portillo, female    DOB: Dec 17, 1975, 42 y.o.   MRN: [id number]  Allison JacobsonJulie Annette [name] is a pleasant 42 y.o. year old female who presents to clinic today with Annual Exam (Patient is here today for a CPE without PAP.  She is currently fasting.  Patient refused PSQ-9 as she saw therapist and psychiatrist this week.)  on 01/02/2018  HPI:  Patient is new to me. Previous pt of Dr. Ermalene SearingBedsole.  Health Maintenance  Topic Date Due  . HIV Screening  11/22/1992  . PAP SMEAR  08/23/2019  . TETANUS/TDAP  10/05/2023  . INFLUENZA VACCINE  Completed     She does she GYN yearly [facility]( [facility]). H/o abnormal pap smears requiring colposcopy years ago. Has had ablation. Mammogram done in 10/2017.  Mood disorder- had been stable on zoloft until she had severe anxiety with panic attacks in 05/2017 during manic episode. Chart reviewed.  Started on seroquel at that time, now on Depakote and Klonipin.  Followed by psychiatry and psychology- saw them both this week. Medication recently adjusted due to weight gain- Sphris d/c'd.  Lab Results  Component Value Date   CHOL 141 08/29/2016   HDL 40.70 08/29/2016   LDLCALC 82 08/29/2016   TRIG 89.0 08/29/2016   CHOLHDL 3 08/29/2016   Lab Results  Component Value Date   CREATININE 1.13 09/17/2017   Lab Results  Component Value Date   WBC 8.5 09/17/2017   HGB 12.9 09/17/2017   HCT 37.6 09/17/2017   MCV 91.1 09/17/2017   PLT 224.0 09/17/2017   Lab Results  Component Value Date   TSH 2.53 09/17/2017     Current Outpatient Medications on File Prior to Visit  Medication Sig Dispense Refill  . clonazePAM (KLONOPIN) 0.5 MG tablet Take 1 tablet (0.5 mg total) by mouth daily as needed for anxiety. 30 tablet 0  . divalproex (DEPAKOTE ER) 500 MG 24 hr tablet Take 2 tablets by mouth at bedtime.  2   No current facility-administered medications on file prior to visit.     No Known Allergies  Past Medical  History:  Diagnosis Date  . Depression   . Neuropathy   . Thyroid disease     Past Surgical History:  Procedure Laterality Date  . APPENDECTOMY    . TUBAL LIGATION      Family History  Problem Relation Age of Onset  . Thyroid disease Mother        thyroid removed at age 41  . Depression Mother   . Arthritis Father   . Hypertension Father   . Cancer Maternal Grandmother 80       breast  . Neuropathy Neg Hx   . Migraines Neg Hx     Social History   Socioeconomic History  . Marital status: Married    Spouse name: Not on file  . Number of children: Not on file  . Years of education: Not on file  . Highest education level: Not on file  Social Needs  . Financial resource strain: Not on file  . Food insecurity - worry: Not on file  . Food insecurity - inability: Not on file  . Transportation needs - medical: Not on file  . Transportation needs - non-medical: Not on file  Occupational History  . Not on file  Tobacco Use  . Smoking status: Former Smoker    Packs/day: 0.50    Years: 5.00    Pack years: 2.50  Types: Cigarettes    Last attempt to quit: 12/05/2007    Years since quitting: 10.0  . Smokeless tobacco: Never Used  Substance and Sexual Activity  . Alcohol use: Yes    Comment: occ, 1-2 a week  . Drug use: No  . Sexual activity: Yes    Birth control/protection: Surgical  Other Topics Concern  . Not on file  Social History Narrative  . Not on file   The PMH, PSH, Social History, Family History, Medications, and allergies have been reviewed in [facility], and have been updated if relevant.' Review of Systems  Constitutional: Positive for unexpected weight change.  HENT: Negative.   Eyes: Negative.   Respiratory: Negative.   Cardiovascular: Negative.   Gastrointestinal: Negative.   Endocrine: Negative.   Genitourinary: Negative.   Musculoskeletal: Negative.   Skin: Negative.   Allergic/Immunologic: Negative.   Neurological: Negative.   Hematological:  Negative.   Psychiatric/Behavioral: Negative.   All other systems reviewed and are negative.      Objective:    BP 116/64 (BP Location: Left Arm, Patient Position: Sitting, Cuff Size: Normal)   Pulse 68   Temp 98.6 F (37 C) (Oral)   Ht 5' 5.25" (1.657 m)   Wt 221 lb 12.8 oz (100.6 kg)   SpO2 99%   BMI 36.63 kg/m    Physical Exam    General:  Well-developed,well-nourished,in no acute distress; alert,appropriate and cooperative throughout examination Head:  normocephalic and atraumatic.   Eyes:  vision grossly intact, PERRL Ears:  R ear normal and L ear normal externally, TMs clear bilaterally Nose:  no external deformity.   Mouth:  good dentition.   Neck:  No deformities, masses, or tenderness noted. Lungs:  Normal respiratory effort, chest expands symmetrically. Lungs are clear to auscultation, no crackles or wheezes. Heart:  Normal rate and regular rhythm. S1 and S2 normal without gallop, murmur, click, rub or other extra sounds. Abdomen:  Bowel sounds positive,abdomen soft and non-tender without masses, organomegaly or hernias noted. Msk:  No deformity or scoliosis noted of thoracic or lumbar spine.   Extremities:  No clubbing, cyanosis, edema, or deformity noted with normal full range of motion of all joints.   Neurologic:  alert & oriented X3 and gait normal.   Skin:  Intact without suspicious lesions or rashes Cervical Nodes:  No lymphadenopathy noted Axillary Nodes:  No palpable lymphadenopathy Psych:  Cognition and judgment appear intact. Alert and cooperative with normal attention span and concentration. No apparent delusions, illusions, hallucinations      Assessment & Plan:   Well woman exam (no gynecological exam) - Plan: CBC with Differential/Platelet, Comprehensive metabolic panel, Lipid panel  History of Graves' disease - Plan: TSH  Major depressive disorder, recurrent episode, moderate (HCC)  GAD (generalized anxiety disorder) No Follow-up on  file.

## 2018-01-02 NOTE — Patient Instructions (Signed)
Great to see you. I will call you with your lab results from today and you can view them online.   

## 2018-01-02 NOTE — Assessment & Plan Note (Signed)
Reviewed preventive care protocols, scheduled due services, and updated immunizations Discussed nutrition, exercise, diet, and healthy lifestyle.  

## 2018-01-02 NOTE — Assessment & Plan Note (Signed)
Managed by psych- currently adjusting medications.

## 2018-01-30 ENCOUNTER — Ambulatory Visit (INDEPENDENT_AMBULATORY_CARE_PROVIDER_SITE_OTHER): Payer: Managed Care, Other (non HMO) | Admitting: Vascular Surgery

## 2018-03-04 ENCOUNTER — Other Ambulatory Visit: Payer: Self-pay

## 2018-04-19 ENCOUNTER — Encounter: Payer: Self-pay | Admitting: Nurse Practitioner

## 2018-04-19 ENCOUNTER — Ambulatory Visit (INDEPENDENT_AMBULATORY_CARE_PROVIDER_SITE_OTHER): Payer: 59 | Admitting: Nurse Practitioner

## 2018-04-19 VITALS — BP 107/74 | HR 78 | Temp 97.9°F | Ht 65.25 in | Wt 221.0 lb

## 2018-04-19 DIAGNOSIS — S8990XA Unspecified injury of unspecified lower leg, initial encounter: Secondary | ICD-10-CM | POA: Diagnosis not present

## 2018-04-19 DIAGNOSIS — M25562 Pain in left knee: Secondary | ICD-10-CM | POA: Diagnosis not present

## 2018-04-19 MED ORDER — TRAMADOL HCL 50 MG PO TABS: 50.0000 mg | ORAL_TABLET | Freq: Three times a day (TID) | ORAL | 0 refills | Status: AC | PRN

## 2018-04-19 MED ORDER — NAPROXEN 500 MG PO TABS: 500.0000 mg | ORAL_TABLET | Freq: Two times a day (BID) | ORAL | 0 refills | Status: DC

## 2018-04-19 NOTE — Patient Instructions (Addendum)
Left hinged Knee brace given to patient in office.  Make appt with sports medicine for additional evaluation.  Use knee brace during the day and off at night.  Apply cold compress 2-3times a day (15-71mins at a time)  Knee Pain, Adult Knee pain in adults is common. It can be caused by many things, including:  Arthritis.  A fluid-filled sac (cyst) or growth in your knee.  An infection in your knee.  An injury that will not heal.  Damage, swelling, or irritation of the tissues that support your knee.  Knee pain is usually not a sign of a serious problem. The pain may go away on its own with time and rest. If it does not, a health care provider may order tests to find the cause of the pain. These may include:  Imaging tests, such as an X-ray, MRI, or ultrasound.  Joint aspiration. In this test, fluid is removed from the knee.  Arthroscopy. In this test, a lighted tube is inserted into knee and an image is projected onto a TV screen.  A biopsy. In this test, a sample of tissue is removed from the body and studied under a microscope.  Follow these instructions at home: Pay attention to any changes in your symptoms. Take these actions to relieve your pain. Activity  Rest your knee.  Do not do things that cause pain or make pain worse.  Avoid high-impact activities or exercises, such as running, jumping rope, or doing jumping jacks. General instructions  Take over-the-counter and prescription medicines only as told by your health care provider.  Raise (elevate) your knee above the level of your heart when you are sitting or lying down.  Sleep with a pillow under your knee.  If directed, apply ice to the knee: ? Put ice in a plastic bag. ? Place a towel between your skin and the bag. ? Leave the ice on for 20 minutes, 2-3 times a day.  Ask your health care provider if you should wear an elastic knee support.  Lose weight if you are overweight. Extra weight can put  pressure on your knee.  Do not use any products that contain nicotine or tobacco, such as cigarettes and e-cigarettes. Smoking may slow the healing of any bone and joint problems that you may have. If you need help quitting, ask your health care provider. Contact a health care provider if:  Your knee pain continues, changes, or gets worse.  You have a fever along with knee pain.  Your knee buckles or locks up.  Your knee swells, and the swelling becomes worse. Get help right away if:  Your knee feels warm to the touch.  You cannot move your knee.  You have severe pain in your knee.  You have chest pain.  You have trouble breathing. Summary  Knee pain in adults is common. It can be caused by many things, including, arthritis, infection, cysts, or injury.  Knee pain is usually not a sign of a serious problem, but if it does not go away, a health care provider may perform tests to know the cause of the pain.  Pay attention to any changes in your symptoms. Relieve your pain with rest, medicines, light activity, and use of ice.  Get help if your pain continues or becomes very severe, or if your knee buckles or locks up, or if you have chest pain or trouble breathing. This information is not intended to replace advice given to you by your health  care provider. Make sure you discuss any questions you have with your health care provider. Document Released: 09/17/2007 Document Revised: 11/10/2016 Document Reviewed: 11/10/2016 Elsevier Interactive Patient Education  [employer].

## 2018-04-19 NOTE — Progress Notes (Signed)
Subjective:  Patient ID: Allison [name], female    DOB: [birth year]  Age: 43 y.o. MRN: [id number]  CC: Knee Pain (both knee painfu,left is worsel/work out 2 days ago/tylenol or ibuprofen not helping)  Knee Pain   The incident occurred 2 days ago. The incident occurred at the gym. Injury mechanism: repetitive jumping (boot camp exercise) The pain is present in the left knee. The quality of the pain is described as aching, burning and shooting. The pain is at a severity of 10/10. The pain is severe. The pain has been constant since onset. Associated symptoms include an inability to bear weight. Pertinent negatives include no loss of motion, loss of sensation, muscle weakness, numbness or tingling. The symptoms are aggravated by movement, palpation and weight bearing. She has tried non-weight bearing, rest and ice for the symptoms. The treatment provided mild relief.   Outpatient Medications Prior to Visit  Medication Sig Dispense Refill  . cariprazine (VRAYLAR) capsule Take 1 capsule (1.5 mg total) by mouth daily. 30 capsule 0  . clonazePAM (KLONOPIN) 0.5 MG tablet Take 1 tablet (0.5 mg total) by mouth daily as needed for anxiety. 30 tablet 0  . divalproex (DEPAKOTE ER) 500 MG 24 hr tablet Take 2 tablets by mouth at bedtime.  2   No facility-administered medications prior to visit.     ROS See HPI  Objective:  BP 107/74   Pulse 78   Temp 97.9 F (36.6 C) (Oral)   Ht 5' 5.25" (1.657 m)   Wt 221 lb (100.2 kg)   SpO2 98%   BMI 36.50 kg/m   BP Readings from Last 3 Encounters:  04/19/18 107/74  01/02/18 116/64  12/12/17 100/63    Wt Readings from Last 3 Encounters:  04/19/18 221 lb (100.2 kg)  01/02/18 221 lb 12.8 oz (100.6 kg)  12/12/17 219 lb (99.3 kg)    Physical Exam  Constitutional: She is oriented to person, place, and time. No distress.  Cardiovascular: Normal rate.  Pulmonary/Chest: Effort normal.  Musculoskeletal: She exhibits edema and tenderness. She  exhibits no deformity.       Left knee: She exhibits swelling. She exhibits normal range of motion, no effusion, no erythema and normal patellar mobility. Tenderness found. Medial joint line, lateral joint line and patellar tendon tenderness noted.  Neurological: She is alert and oriented to person, place, and time.  Psychiatric: She has a normal mood and affect. Her behavior is normal.  Vitals reviewed.   Lab Results  Component Value Date   WBC 7.5 01/02/2018   HGB 14.6 01/02/2018   HCT 43.6 01/02/2018   PLT 217.0 01/02/2018   GLUCOSE 87 01/02/2018   CHOL 184 01/02/2018   TRIG 200.0 (H) 01/02/2018   HDL 38.20 (L) 01/02/2018   LDLCALC 106 (H) 01/02/2018   ALT 16 01/02/2018   AST 16 01/02/2018   NA 138 01/02/2018   K 4.1 01/02/2018   CL 102 01/02/2018   CREATININE 0.80 01/02/2018   BUN 15 01/02/2018   CO2 26 01/02/2018   TSH 2.36 01/02/2018   HGBA1C 5.4 05/22/2016    US Transvaginal Non-ob  Result Date: 04/13/2017 CLINICAL DATA:  Right lower quadrant pain for 2 days. History of a uterine ablation in January 2018. EXAM: TRANSABDOMINAL AND TRANSVAGINAL ULTRASOUND OF PELVIS TECHNIQUE: Both transabdominal and transvaginal ultrasound examinations of the pelvis were performed. Transabdominal technique was performed for global imaging of the pelvis including uterus, ovaries, adnexal regions, and pelvic cul-de-sac. It was necessary to proceed  with endovaginal exam following the transabdominal exam to visualize the uterus, endometrium and ovaries to better advantage. COMPARISON:  None FINDINGS: Uterus Measurements: 8.6 x 3.4 x 4.5 cm. No fibroids or other mass visualized. Endometrium Thickness: 8 mm. Sliver of fluid noted in the endometrial canal. No endometrial masses. Right ovary Measurements: 2.6 x 2.6 x 2.4 cm. Dominant simple appearing follicular cyst measuring 19 mm. Ovary otherwise unremarkable. No adnexal masses. Left ovary Measurements: 3.6 x 1.8 x 3.6 cm. Normal appearance/no adnexal  mass. Other findings No abnormal free fluid. IMPRESSION: 1. Normal exam. No findings to account for right lower quadrant abdominal pain. Electronically Signed   By: Amie Portland M.D.   On: 04/13/2017 16:35   US Pelvis Complete  Result Date: 04/13/2017 CLINICAL DATA:  Right lower quadrant pain for 2 days. History of a uterine ablation in January 2018. EXAM: TRANSABDOMINAL AND TRANSVAGINAL ULTRASOUND OF PELVIS TECHNIQUE: Both transabdominal and transvaginal ultrasound examinations of the pelvis were performed. Transabdominal technique was performed for global imaging of the pelvis including uterus, ovaries, adnexal regions, and pelvic cul-de-sac. It was necessary to proceed with endovaginal exam following the transabdominal exam to visualize the uterus, endometrium and ovaries to better advantage. COMPARISON:  None FINDINGS: Uterus Measurements: 8.6 x 3.4 x 4.5 cm. No fibroids or other mass visualized. Endometrium Thickness: 8 mm. Sliver of fluid noted in the endometrial canal. No endometrial masses. Right ovary Measurements: 2.6 x 2.6 x 2.4 cm. Dominant simple appearing follicular cyst measuring 19 mm. Ovary otherwise unremarkable. No adnexal masses. Left ovary Measurements: 3.6 x 1.8 x 3.6 cm. Normal appearance/no adnexal mass. Other findings No abnormal free fluid. IMPRESSION: 1. Normal exam. No findings to account for right lower quadrant abdominal pain. Electronically Signed   By: Amie Portland M.D.   On: 04/13/2017 16:35    Assessment & Plan:   [name] was seen today for knee pain.  Diagnoses and all orders for this visit:  Knee injury, initial encounter -     Ambulatory referral to Sports Medicine -     traMADol (ULTRAM) 50 MG tablet; Take 1 tablet (50 mg total) by mouth every 8 (eight) hours as needed for up to 3 days for severe pain. -     naproxen (NAPROSYN) 500 MG tablet; Take 1 tablet (500 mg total) by mouth 2 (two) times daily with a meal.  Left anterior knee pain -     Ambulatory  referral to Sports Medicine -     traMADol (ULTRAM) 50 MG tablet; Take 1 tablet (50 mg total) by mouth every 8 (eight) hours as needed for up to 3 days for severe pain. -     naproxen (NAPROSYN) 500 MG tablet; Take 1 tablet (500 mg total) by mouth 2 (two) times daily with a meal.   I am having [name] start on traMADol and naproxen. I am also having her maintain her clonazePAM, divalproex, and cariprazine.  Meds ordered this encounter  Medications  . traMADol (ULTRAM) 50 MG tablet    Sig: Take 1 tablet (50 mg total) by mouth every 8 (eight) hours as needed for up to 3 days for severe pain.    Dispense:  9 tablet    Refill:  0    Order Specific Question:   Supervising Provider    Answer:   Dianne Dun [[id number]]  . naproxen (NAPROSYN) 500 MG tablet    Sig: Take 1 tablet (500 mg total) by mouth 2 (two) times daily with a  meal.    Dispense:  14 tablet    Refill:  0    Order Specific Question:   Supervising Provider    Answer:   Dianne Dun [[id number]]    Follow-up: No follow-ups on file.  Alysia Penna, NP

## 2018-05-11 NOTE — Progress Notes (Signed)
[name] [address] [place], [place] [address] Phone: [phone] Subjective:     CC: Bilateral knee pain  FAO:[id number]  Allison [name] is a 42 y.o. female coming in with complaint of bilateral knee pain, left worse than right. She has been having intermittent pain for years. She went to a workout [employer] and she had pain following those workouts. Pain is surrounding the patella. Patient has pain going down the stairs more than going upstairs, dull, achy. Patient has been using a knee brace.     Past Medical History:  Diagnosis Date  . Depression   . Neuropathy   . Thyroid disease    Past Surgical History:  Procedure Laterality Date  . APPENDECTOMY    . TUBAL LIGATION     Social History   Socioeconomic History  . Marital status: Married    Spouse name: Not on file  . Number of children: Not on file  . Years of education: Not on file  . Highest education level: Not on file  Occupational History  . Not on file  Social Needs  . Financial resource strain: Not on file  . Food insecurity:    Worry: Not on file    Inability: Not on file  . Transportation needs:    Medical: Not on file    Non-medical: Not on file  Tobacco Use  . Smoking status: Former Smoker    Packs/day: 0.50    Years: 5.00    Pack years: 2.50    Types: Cigarettes    Last attempt to quit: 12/05/2007    Years since quitting: 10.4  . Smokeless tobacco: Never Used  Substance and Sexual Activity  . Alcohol use: Yes    Comment: occ, 1-2 a week  . Drug use: No  . Sexual activity: Yes    Birth control/protection: Surgical  Lifestyle  . Physical activity:    Days per week: Not on file    Minutes per session: Not on file  . Stress: Not on file  Relationships  . Social connections:    Talks on phone: Not on file    Gets together: Not on file    Attends religious service: Not on file    Active member of club or organization: Not on file    Attends  meetings of clubs or organizations: Not on file    Relationship status: Not on file  Other Topics Concern  . Not on file  Social History Narrative  . Not on file   No Known Allergies Family History  Problem Relation Age of Onset  . Thyroid disease Mother        thyroid removed at age 60  . Depression Mother   . Arthritis Father   . Hypertension Father   . Cancer Maternal Grandmother 75       breast  . Depression Maternal Aunt   . Neuropathy Neg Hx   . Migraines Neg Hx      Past medical history, social, surgical and family history all reviewed in electronic medical record.  No pertanent information unless stated regarding to the chief complaint.   Review of Systems:Review of systems updated and as accurate as of 05/13/18  No headache, visual changes, nausea, vomiting, diarrhea, constipation, dizziness, abdominal pain, skin rash, fevers, chills, night sweats, weight loss, swollen lymph nodes, body aches,, chest pain, shortness of breath, mood changes.  Positive muscle aches and joint swelling  Objective  Blood pressure 102/74,  pulse 81, height 5' 2.5" (1.588 m), weight 221 lb (100.2 kg), SpO2 98 %. Systems examined below as of 05/13/18   General: No apparent distress alert and oriented x3 mood and affect normal, dressed appropriately.  HEENT: Pupils equal, extraocular movements intact  Respiratory: Patient's speak in full sentences and does not appear short of breath  Cardiovascular: No lower extremity edema, non tender, no erythema  Skin: Warm dry intact with no signs of infection or rash on extremities or on axial skeleton.  Abdomen: Soft nontender  Neuro: Cranial nerves II through XII are intact, neurovascularly intact in all extremities with 2+ DTRs and 2+ pulses.  Lymph: No lymphadenopathy of posterior or anterior cervical chain or axillae bilaterally.  Gait normal with good balance and coordination.  MSK:  Non tender with full range of motion and good stability and  symmetric strength and tone of shoulders, elbows, wrist, hip, and ankles bilaterally.  Knee: Bilateral Normal to inspection with no erythema or effusion or obvious bony abnormalities. Palpation shows tenderness over the patellofemoral joint.  Range of motion does show that there is some lateral tracking of the patella ROM full in flexion and extension and lower leg rotation. Ligaments with solid consistent endpoints including ACL, PCL, LCL, MCL. Negative Mcmurray's, Apley's, and Thessalonian tests. painful patellar compression. Patellar glide with moderate crepitus. Patellar and quadriceps tendons unremarkable. Hamstring and quadriceps strength is normal.  MSK US performed of: Left knee This study was ordered, performed, and interpreted by Terrilee FilesZach Smith D.O.  Knee: All structures visualized. Anteromedial, anterolateral, posteromedial, and posterolateral menisci unremarkable without tearing, fraying, effusion, or displacement. Patellofemoral show significant narrowing bilaterally left greater than right with some mild loose bodies but no significant swelling.  Patellar Tendon unremarkable on long and transverse views without effusion. No abnormality of prepatellar bursa. LCL and MCL unremarkable on long and transverse views. No abnormality of origin of medial or lateral head of the gastrocnemius.  IMPRESSION: Patellofemoral arthritis  [id number]; 15 additional minutes spent for Therapeutic exercises as stated in above notes.  This included exercises focusing on stretching, strengthening, with significant focus on eccentric aspects.   Long term goals include an improvement in range of motion, strength, endurance as well as avoiding reinjury. Patient's frequency would include in 1-2 times a day, 3-5 times a week for a duration of 6-12 weeks. Patellofemoral Syndrome  Reviewed anatomy using anatomical model and how PFS occurs.  Given rehab exercises handout for VMO, hip abductors, core, entire  kinetic chain including proprioception exercises including cone touches, step downs, hip elevations and turn outs.  Could benefit from PT, regular exercise, upright biking, and a PFS knee brace to assist with tracking abnormalities.  Proper technique shown and discussed handout in great detail with ATC.  All questions were discussed and answered.      Impression and Recommendations:     This case required medical decision making of moderate complexity.      Note: This dictation was prepared with Dragon dictation along with smaller phrase technology. Any transcriptional errors that result from this process are unintentional.

## 2018-05-13 ENCOUNTER — Encounter: Payer: Self-pay | Admitting: Family Medicine

## 2018-05-13 ENCOUNTER — Ambulatory Visit: Payer: 59 | Admitting: Family Medicine

## 2018-05-13 ENCOUNTER — Ambulatory Visit: Payer: Self-pay

## 2018-05-13 VITALS — BP 102/74 | HR 81 | Ht 62.5 in | Wt 221.0 lb

## 2018-05-13 DIAGNOSIS — G8929 Other chronic pain: Secondary | ICD-10-CM

## 2018-05-13 DIAGNOSIS — M1710 Unilateral primary osteoarthritis, unspecified knee: Secondary | ICD-10-CM

## 2018-05-13 DIAGNOSIS — M25562 Pain in left knee: Secondary | ICD-10-CM | POA: Diagnosis not present

## 2018-05-13 MED ORDER — DICLOFENAC SODIUM 2 % TD SOLN: 2.0000 g | Freq: Two times a day (BID) | TRANSDERMAL | 3 refills | Status: DC

## 2018-05-13 NOTE — Assessment & Plan Note (Signed)
Bilateral.  Discussed icing regimen and home exercise.  Discussed which activities of doing which wants to avoid.  Patient does have a brace already for the left knee will hold on giving right knee 1.  Discussed certain activities.  Patient will follow-up again in 4 weeks.

## 2018-05-13 NOTE — Patient Instructions (Signed)
Good to see you  Ice 20 minutes 2 times daily. Usually after activity and before bed. Exercises 3 times a week.  Wear the brace with working out and a lot of walking pennsaid pinkie amount topically 2 times daily as needed.  Over the counter get  Vitamin D 2000 Iu daily  Turmeric 500mg  daily  Tart cherry extract any dose at night Biking and elliptical should be good  See me again in 4-6 weeks

## 2018-06-10 ENCOUNTER — Encounter: Payer: Self-pay | Admitting: Family Medicine

## 2018-06-10 ENCOUNTER — Ambulatory Visit: Payer: 59 | Admitting: Family Medicine

## 2018-06-10 DIAGNOSIS — L509 Urticaria, unspecified: Secondary | ICD-10-CM | POA: Diagnosis not present

## 2018-06-10 NOTE — Assessment & Plan Note (Signed)
Agree likely hives/allergic and more likely due to the tegretol than one time dose of vitamin/supplement. Finish course of prednisone. She will call Dr. Maryruth BunKapur and tell her what we discussed today.  I also typed in AVS to ask her if vistaril would be appropriate for itching since benadryl has been ineffective.  Given that it can impact her anxiety, I would prefer Dr. Maryruth BunKapur advise on this prior to starting Vistaril. The patient indicates understanding of these issues and agrees with the plan.

## 2018-06-10 NOTE — Progress Notes (Signed)
Subjective:   Patient ID: Allison [name], female    DOB: [birth year], 42 y.o.   MRN: [id number]  Allison [name] is a pleasant 42 y.o. year old female who presents to clinic today with Urticaria (Patient is here today C/O hives that presented on 7.4.2019.  She was started on steroid dose pack on 7.5.19 by a "Teledoc" as Benadryl and topical didn't touch it.  Took a supplement on 7.3.19 of Vit-D, Tumeric, and Tart Cherry.  It has spread all over her body.  She thought maybe that was the culprit but she also was started on Carbemazepine ER 400mg  1qd on 6.23.19 and is unsure if that could cause it as it has been in her system for a while.  She spoke with Dr. Maryruth Bun and she said may have to D/C that ) and addendum (she is to let Dr. Maryruth Bun know what transpires from appt today.)  on 06/10/2018  HPI:  Hives-  Started on 7.4.2019. She called [facility] on 06/07/18- was prescribed a steroid dose pack and advised to take Benadryl.  Symptoms have not improved- have now spread "all over."   Wasn't itchy at first but now itchy.  Took a supplement on 7.3.19 of Vit-D, Tumeric, and Tart Cherry. She thought maybe she was allergic to the suppelment but she also was started on Carbemazepine ER 400mg  1qd on 6.23.19 (from depakote due to weight gain) and is unsure if that could cause it as it has been in her system for a while.   She spoke with Dr. Maryruth Bun and she said may have to D/C that.  She was advised to let Dr. Maryruth Bun know what I advise in this office visit today.  Has 3 day of prednisone left.    Current Outpatient Medications on File Prior to Visit  Medication Sig Dispense Refill  . carbamazepine (TEGRETOL XR) 400 MG 12 hr tablet Take 1 tablet by mouth at bedtime.  0  . clonazePAM (KLONOPIN) 0.5 MG tablet Take 1 tablet (0.5 mg total) by mouth daily as needed for anxiety. 30 tablet 0  . methylPREDNISolone (MEDROL DOSEPAK) 4 MG TBPK tablet FPD  0  . VRAYLAR capsule Take 3 mg by mouth daily.  2  .  Diclofenac Sodium 2 % SOLN Place 2 g onto the skin 2 (two) times daily. (Patient not taking: Reported on 06/10/2018) 112 g 3   No current facility-administered medications on file prior to visit.     No Known Allergies  Past Medical History:  Diagnosis Date  . Depression   . Neuropathy   . Thyroid disease     Past Surgical History:  Procedure Laterality Date  . APPENDECTOMY    . TUBAL LIGATION      Family History  Problem Relation Age of Onset  . Thyroid disease Mother        thyroid removed at age 46  . Depression Mother   . Arthritis Father   . Hypertension Father   . Cancer Maternal Grandmother 83       breast  . Depression Maternal Aunt   . Neuropathy Neg Hx   . Migraines Neg Hx     Social History   Socioeconomic History  . Marital status: Married    Spouse name: Not on file  . Number of children: Not on file  . Years of education: Not on file  . Highest education level: Not on file  Occupational History  . Not on file  Social Needs  .  Financial resource strain: Not on file  . Food insecurity:    Worry: Not on file    Inability: Not on file  . Transportation needs:    Medical: Not on file    Non-medical: Not on file  Tobacco Use  . Smoking status: Former Smoker    Packs/day: 0.50    Years: 5.00    Pack years: 2.50    Types: Cigarettes    Last attempt to quit: 12/05/2007    Years since quitting: 10.5  . Smokeless tobacco: Never Used  Substance and Sexual Activity  . Alcohol use: Yes    Comment: occ, 1-2 a week  . Drug use: No  . Sexual activity: Yes    Birth control/protection: Surgical  Lifestyle  . Physical activity:    Days per week: Not on file    Minutes per session: Not on file  . Stress: Not on file  Relationships  . Social connections:    Talks on phone: Not on file    Gets together: Not on file    Attends religious service: Not on file    Active member of club or organization: Not on file    Attends meetings of clubs or  organizations: Not on file    Relationship status: Not on file  . Intimate partner violence:    Fear of current or ex partner: Not on file    Emotionally abused: Not on file    Physically abused: Not on file    Forced sexual activity: Not on file  Other Topics Concern  . Not on file  Social History Narrative  . Not on file   The PMH, PSH, Social History, Family History, Medications, and allergies [facility], and have been updated if relevant.   Review of Systems  Constitutional: Negative.   Skin: Positive for rash.  All other systems reviewed and are negative.      Objective:    BP 116/86 (BP Location: Left Arm, Patient Position: Sitting, Cuff Size: Normal)   Pulse (!) 117   Temp 99 F (37.2 C) (Oral)   Ht 5' 2.5" (1.588 m)   Wt 223 lb (101.2 kg)   SpO2 98%   BMI 40.14 kg/m    Physical Exam  Constitutional: She is oriented to person, place, and time. She appears well-developed and well-nourished. No distress.  HENT:  Head: Normocephalic and atraumatic.  Cardiovascular: Normal rate.  Pulmonary/Chest: Effort normal.  Neurological: She is alert and oriented to person, place, and time.  Skin: Skin is dry. Rash noted. Rash is not urticarial. She is not diaphoretic.  Psychiatric: Her mood appears anxious.  Nursing note and vitals reviewed.         Assessment & Plan:   Hives No follow-ups on file.

## 2018-06-10 NOTE — Patient Instructions (Signed)
Great to see you.  I am concerned this could be a reaction to the Tegretol. Please call Dr. Maryruth BunKapur and let her know this.  While talking with her, ask her if it would be okay for you to start vistaril for itching.

## 2018-06-18 ENCOUNTER — Ambulatory Visit: Payer: 59 | Admitting: Family Medicine

## 2018-10-10 ENCOUNTER — Ambulatory Visit (INDEPENDENT_AMBULATORY_CARE_PROVIDER_SITE_OTHER): Payer: 59

## 2018-10-10 ENCOUNTER — Encounter: Payer: Self-pay | Admitting: *Deleted

## 2018-10-10 DIAGNOSIS — Z23 Encounter for immunization: Secondary | ICD-10-CM

## 2019-01-06 ENCOUNTER — Ambulatory Visit: Payer: Self-pay | Admitting: Internal Medicine

## 2019-01-20 ENCOUNTER — Ambulatory Visit: Payer: 59 | Admitting: Internal Medicine

## 2019-01-20 ENCOUNTER — Encounter: Payer: Self-pay | Admitting: Internal Medicine

## 2019-01-20 VITALS — BP 108/82 | HR 77 | Resp 16 | Ht 62.5 in | Wt 215.0 lb

## 2019-01-20 DIAGNOSIS — E661 Drug-induced obesity: Secondary | ICD-10-CM | POA: Diagnosis not present

## 2019-01-20 DIAGNOSIS — R0683 Snoring: Secondary | ICD-10-CM

## 2019-01-20 DIAGNOSIS — Z6838 Body mass index (BMI) 38.0-38.9, adult: Secondary | ICD-10-CM | POA: Diagnosis not present

## 2019-01-20 DIAGNOSIS — G4719 Other hypersomnia: Secondary | ICD-10-CM | POA: Diagnosis not present

## 2019-01-20 NOTE — Patient Instructions (Signed)

## 2019-01-20 NOTE — Progress Notes (Signed)
[facility] [address] [place], [place] [id number]  Pulmonary Sleep Medicine   Office Visit Note  Patient Name: Allison JacobsonJulie Annette [name] DOB: 1976/08/04 MRN [id number]  Date of Service: 01/20/2019  Complaints/HPI: Pt referred by Dr. Maryruth BunKapur, for ongoing insomnia.  Pt reports she wakes up frequently, sometimes every hour.  Some nights she sleeps all night, but wakes up exhausted.  She reports hypersomnia.  She requires a nap in the afternoons and excessive amounts of caffeine. She has been told she is snoring frequently.    ROS  General: (-) fever, (-) chills, (-) night sweats, (-) weakness Skin: (-) rashes, (-) itching,. Eyes: (-) visual changes, (-) redness, (-) itching. Nose and Sinuses: (-) nasal stuffiness or itchiness, (-) postnasal drip, (-) nosebleeds, (-) sinus trouble. Mouth and Throat: (-) sore throat, (-) hoarseness. Neck: (-) swollen glands, (-) enlarged thyroid, (-) neck pain. Respiratory: - cough, (-) bloody sputum, - shortness of breath, - wheezing. Cardiovascular: - ankle swelling, (-) chest pain. Lymphatic: (-) lymph node enlargement. Neurologic: (-) numbness, (-) tingling. Psychiatric: (-) anxiety, (-) depression   Current Medication: Outpatient Encounter Medications as of 01/20/2019  Medication Sig  . clonazePAM (KLONOPIN) 0.5 MG tablet Take 1 tablet (0.5 mg total) by mouth daily as needed for anxiety.  . hydrOXYzine (VISTARIL) 50 MG capsule Take 50 mg by mouth 3 (three) times daily as needed.  . lamoTRIgine (LAMICTAL) 200 MG tablet TK 1 T PO QAM  . VRAYLAR 4.5 MG CAPS Take 1 capsule by mouth daily.   . [DISCONTINUED] carbamazepine (TEGRETOL XR) 400 MG 12 hr tablet Take 1 tablet by mouth at bedtime.  . [DISCONTINUED] Diclofenac Sodium 2 % SOLN Place 2 g onto the skin 2 (two) times daily. (Patient not taking: Reported on 06/10/2018)  . [DISCONTINUED] VRAYLAR capsule Take 3 mg by mouth daily.   No facility-administered encounter medications on file as  of 01/20/2019.     Surgical History: Past Surgical History:  Procedure Laterality Date  . APPENDECTOMY    . TUBAL LIGATION      Medical History: Past Medical History:  Diagnosis Date  . Anxiety   . Bipolar disorder, in full remission, most recent episode hypomanic (HCC)   . Depression   . Insomnia   . Neuropathy   . Thyroid disease     Family History: Family History  Problem Relation Age of Onset  . Thyroid disease Mother        thyroid removed at age 222  . Depression Mother   . Arthritis Father   . Hypertension Father   . Cancer Maternal Grandmother [phone]       breast  . Depression Maternal Aunt   . Neuropathy Neg Hx   . Migraines Neg Hx     Social History: Social History   Socioeconomic History  . Marital status: Married    Spouse name: Not on file  . Number of children: Not on file  . Years of education: Not on file  . Highest education level: Not on file  Occupational History  . Not on file  Social Needs  . Financial resource strain: Not on file  . Food insecurity:    Worry: Not on file    Inability: Not on file  . Transportation needs:    Medical: Not on file    Non-medical: Not on file  Tobacco Use  . Smoking status: Former Smoker    Packs/day: 0.50    Years: 5.00    Pack years: 2.50  Types: Cigarettes    Last attempt to quit: 12/05/2007    Years since quitting: 11.1  . Smokeless tobacco: Never Used  Substance and Sexual Activity  . Alcohol use: Yes    Comment: occ, 1-2 a week  . Drug use: No  . Sexual activity: Yes    Birth control/protection: Surgical  Lifestyle  . Physical activity:    Days per week: Not on file    Minutes per session: Not on file  . Stress: Not on file  Relationships  . Social connections:    Talks on phone: Not on file    Gets together: Not on file    Attends religious service: Not on file    Active member of club or organization: Not on file    Attends meetings of clubs or organizations: Not on file     Relationship status: Not on file  . Intimate partner violence:    Fear of current or ex partner: Not on file    Emotionally abused: Not on file    Physically abused: Not on file    Forced sexual activity: Not on file  Other Topics Concern  . Not on file  Social History Narrative  . Not on file    Vital Signs: Blood pressure 108/82, pulse 77, resp. rate 16, height 5' 2.5" (1.588 m), weight 215 lb (97.5 kg), SpO2 96 %.  Examination: General Appearance: The patient is well-developed, well-nourished, and in no distress. Skin: Gross inspection of skin unremarkable. Head: normocephalic, no gross deformities. Eyes: no gross deformities noted. ENT: ears appear grossly normal no exudates. Neck: Supple. No thyromegaly. No LAD. Respiratory: clear bilaterally. Cardiovascular: Normal S1 and S2 without murmur or rub. Extremities: No cyanosis. pulses are equal. Neurologic: Alert and oriented. No involuntary movements.  LABS: No results found for this or any previous visit (from the past 2160 hour(s)).  Radiology: US Transvaginal Non-ob  Result Date: 04/13/2017 CLINICAL DATA:  Right lower quadrant pain for 2 days. History of a uterine ablation in January 2018. EXAM: TRANSABDOMINAL AND TRANSVAGINAL ULTRASOUND OF PELVIS TECHNIQUE: Both transabdominal and transvaginal ultrasound examinations of the pelvis were performed. Transabdominal technique was performed for global imaging of the pelvis including uterus, ovaries, adnexal regions, and pelvic cul-de-sac. It was necessary to proceed with endovaginal exam following the transabdominal exam to visualize the uterus, endometrium and ovaries to better advantage. COMPARISON:  None FINDINGS: Uterus Measurements: 8.6 x 3.4 x 4.5 cm. No fibroids or other mass visualized. Endometrium Thickness: 8 mm. Sliver of fluid noted in the endometrial canal. No endometrial masses. Right ovary Measurements: 2.6 x 2.6 x 2.4 cm. Dominant simple appearing follicular cyst  measuring 19 mm. Ovary otherwise unremarkable. No adnexal masses. Left ovary Measurements: 3.6 x 1.8 x 3.6 cm. Normal appearance/no adnexal mass. Other findings No abnormal free fluid. IMPRESSION: 1. Normal exam. No findings to account for right lower quadrant abdominal pain. Electronically Signed   By: Amie Portland M.D.   On: 04/13/2017 16:35   US Pelvis Complete  Result Date: 04/13/2017 CLINICAL DATA:  Right lower quadrant pain for 2 days. History of a uterine ablation in January 2018. EXAM: TRANSABDOMINAL AND TRANSVAGINAL ULTRASOUND OF PELVIS TECHNIQUE: Both transabdominal and transvaginal ultrasound examinations of the pelvis were performed. Transabdominal technique was performed for global imaging of the pelvis including uterus, ovaries, adnexal regions, and pelvic cul-de-sac. It was necessary to proceed with endovaginal exam following the transabdominal exam to visualize the uterus, endometrium and ovaries to better advantage. COMPARISON:  None  FINDINGS: Uterus Measurements: 8.6 x 3.4 x 4.5 cm. No fibroids or other mass visualized. Endometrium Thickness: 8 mm. Sliver of fluid noted in the endometrial canal. No endometrial masses. Right ovary Measurements: 2.6 x 2.6 x 2.4 cm. Dominant simple appearing follicular cyst measuring 19 mm. Ovary otherwise unremarkable. No adnexal masses. Left ovary Measurements: 3.6 x 1.8 x 3.6 cm. Normal appearance/no adnexal mass. Other findings No abnormal free fluid. IMPRESSION: 1. Normal exam. No findings to account for right lower quadrant abdominal pain. Electronically Signed   By: Amie Portland M.D.   On: 04/13/2017 16:35    No results found.  No results found.    Assessment and Plan: Patient Active Problem List   Diagnosis Date Noted  . Hives 06/10/2018  . Patellofemoral arthritis 05/13/2018  . Lymphedema 12/12/2017  . Chronic venous insufficiency 12/12/2017  . Class 1 obesity due to excess calories with body mass index (BMI) of 33.0 to 33.9 in adult  09/05/2016  . History of Graves' disease 09/05/2016  . Mood disorder (HCC) 09/05/2016  . GAD (generalized anxiety disorder) 11/13/2014   1. Excessive daytime sleepiness Home sleep study ordered for patient to evaluate for the presence of OSA. - Home sleep test  2. Snoring Due to patient's excessive snoring will get sleep study to rule out OSA. - Home sleep test  3. 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  Risk Modification Advice: She was counseled on portion control guidelines. Restricting daily caloric intake to. . The detrimental long term effects of obesity on her health and ongoing poor compliance was also discussed with the patient.     General Counseling: I have discussed the findings of the evaluation and examination with Raynelle Fanning.  I have also discussed any further diagnostic evaluation thatmay be needed or ordered today. Jalissia verbalizes understanding of the findings of todays visit. We also reviewed her medications today and discussed drug interactions and side effects including but not limited excessive drowsiness and altered mental states. We also discussed that there is always a risk not just to her but also people around her. she has been encouraged to call the office with any questions or concerns that should arise related to todays visit.    Time spent: 25 This patient was seen by Blima Ledger AGNP-C in Collaboration with Dr. Freda Munro as a part of collaborative care agreement.   I have personally obtained a history, examined the patient, evaluated laboratory and imaging results, formulated the assessment and plan and placed orders.    Yevonne Pax, MD [facility] Pulmonary and Critical Care Sleep medicine

## 2019-02-03 ENCOUNTER — Other Ambulatory Visit: Payer: Self-pay | Admitting: Internal Medicine

## 2019-02-04 ENCOUNTER — Encounter: Payer: Self-pay | Admitting: Internal Medicine

## 2019-02-06 ENCOUNTER — Other Ambulatory Visit: Payer: 59 | Admitting: Internal Medicine

## 2019-02-06 DIAGNOSIS — G4710 Hypersomnia, unspecified: Secondary | ICD-10-CM

## 2019-02-11 ENCOUNTER — Ambulatory Visit: Payer: 59 | Admitting: Adult Health

## 2019-02-11 ENCOUNTER — Encounter: Payer: Self-pay | Admitting: Adult Health

## 2019-02-11 VITALS — BP 92/68 | HR 85 | Resp 16 | Ht 64.0 in | Wt 213.0 lb

## 2019-02-11 DIAGNOSIS — R0683 Snoring: Secondary | ICD-10-CM | POA: Diagnosis not present

## 2019-02-11 DIAGNOSIS — E6609 Other obesity due to excess calories: Secondary | ICD-10-CM

## 2019-02-11 DIAGNOSIS — Z6833 Body mass index (BMI) 33.0-33.9, adult: Secondary | ICD-10-CM | POA: Diagnosis not present

## 2019-02-11 DIAGNOSIS — G4719 Other hypersomnia: Secondary | ICD-10-CM

## 2019-02-11 DIAGNOSIS — G4733 Obstructive sleep apnea (adult) (pediatric): Secondary | ICD-10-CM | POA: Diagnosis not present

## 2019-02-11 NOTE — Progress Notes (Signed)
[facility] [address] [place], [place] [address]  Pulmonary Sleep Medicine   Office Visit Note  Patient Name: Allison [name] DOB: 10/16/1976 MRN [id number]  Date of Service: 02/11/2019  Complaints/HPI: Pt is here for follow up on home sleep study.  Home sleep study shows 13 obstructive apneas 2 central apneas and 27 hypopneas.  Giving her a total AHI of 5.1.  The patient's lowest desaturation was 82% and lowest pulse was 45 bpm.  Based on this study the patient will benefit from a CPAP machine.  A titration study will be ordered to further evaluate patient's optimal pressure.  ROS  General: (-) fever, (-) chills, (-) night sweats, (-) weakness Skin: (-) rashes, (-) itching,. Eyes: (-) visual changes, (-) redness, (-) itching. Nose and Sinuses: (-) nasal stuffiness or itchiness, (-) postnasal drip, (-) nosebleeds, (-) sinus trouble. Mouth and Throat: (-) sore throat, (-) hoarseness. Neck: (-) swollen glands, (-) enlarged thyroid, (-) neck pain. Respiratory: - cough, (-) bloody sputum, - shortness of breath, - wheezing. Cardiovascular: - ankle swelling, (-) chest pain. Lymphatic: (-) lymph node enlargement. Neurologic: (-) numbness, (-) tingling. Psychiatric: (-) anxiety, (-) depression   Current Medication: Outpatient Encounter Medications as of 02/11/2019  Medication Sig  . clonazePAM (KLONOPIN) 0.5 MG tablet Take 1 tablet (0.5 mg total) by mouth daily as needed for anxiety.  . hydrOXYzine (VISTARIL) 50 MG capsule Take 50 mg by mouth 3 (three) times daily as needed.  . lamoTRIgine (LAMICTAL) 200 MG tablet TK 1 T PO QAM  . VRAYLAR 4.5 MG CAPS Take 1 capsule by mouth daily.    No facility-administered encounter medications on file as of 02/11/2019.     Surgical History: Past Surgical History:  Procedure Laterality Date  . APPENDECTOMY    . TUBAL LIGATION      Medical History: Past Medical History:  Diagnosis Date  . Anxiety   . Bipolar disorder,  in full remission, most recent episode hypomanic (HCC)   . Depression   . Insomnia   . Neuropathy   . Thyroid disease     Family History: Family History  Problem Relation Age of Onset  . Thyroid disease Mother        thyroid removed at age 26  . Depression Mother   . Arthritis Father   . Hypertension Father   . Cancer Maternal Grandmother 65       breast  . Depression Maternal Aunt   . Neuropathy Neg Hx   . Migraines Neg Hx     Social History: Social History   Socioeconomic History  . Marital status: Married    Spouse name: Not on file  . Number of children: Not on file  . Years of education: Not on file  . Highest education level: Not on file  Occupational History  . Not on file  Social Needs  . Financial resource strain: Not on file  . Food insecurity:    Worry: Not on file    Inability: Not on file  . Transportation needs:    Medical: Not on file    Non-medical: Not on file  Tobacco Use  . Smoking status: Former Smoker    Packs/day: 0.50    Years: 5.00    Pack years: 2.50    Types: Cigarettes    Last attempt to quit: 12/05/2007    Years since quitting: 11.1  . Smokeless tobacco: Never Used  Substance and Sexual Activity  . Alcohol use: Yes  Comment: occ, 1-2 a week  . Drug use: No  . Sexual activity: Yes    Birth control/protection: Surgical  Lifestyle  . Physical activity:    Days per week: Not on file    Minutes per session: Not on file  . Stress: Not on file  Relationships  . Social connections:    Talks on phone: Not on file    Gets together: Not on file    Attends religious service: Not on file    Active member of club or organization: Not on file    Attends meetings of clubs or organizations: Not on file    Relationship status: Not on file  . Intimate partner violence:    Fear of current or ex partner: Not on file    Emotionally abused: Not on file    Physically abused: Not on file    Forced sexual activity: Not on file  Other Topics  Concern  . Not on file  Social History Narrative  . Not on file    Vital Signs: Blood pressure 92/68, pulse 85, resp. rate 16, height 5\' 4"  (1.626 m), weight 213 lb (96.6 kg), SpO2 97 %.  Examination: General Appearance: The patient is well-developed, well-nourished, and in no distress. Skin: Gross inspection of skin unremarkable. Head: normocephalic, no gross deformities. Eyes: no gross deformities noted. ENT: ears appear grossly normal no exudates. Neck: Supple. No thyromegaly. No LAD. Respiratory: clear bilateral. Cardiovascular: Normal S1 and S2 without murmur or rub. Extremities: No cyanosis. pulses are equal. Neurologic: Alert and oriented. No involuntary movements.  LABS: No results found for this or any previous visit (from the past 2160 hour(s)).  Radiology: US Transvaginal Non-ob  Result Date: 04/13/2017 CLINICAL DATA:  Right lower quadrant pain for 2 days. History of a uterine ablation in January 2018. EXAM: TRANSABDOMINAL AND TRANSVAGINAL ULTRASOUND OF PELVIS TECHNIQUE: Both transabdominal and transvaginal ultrasound examinations of the pelvis were performed. Transabdominal technique was performed for global imaging of the pelvis including uterus, ovaries, adnexal regions, and pelvic cul-de-sac. It was necessary to proceed with endovaginal exam following the transabdominal exam to visualize the uterus, endometrium and ovaries to better advantage. COMPARISON:  None FINDINGS: Uterus Measurements: 8.6 x 3.4 x 4.5 cm. No fibroids or other mass visualized. Endometrium Thickness: 8 mm. Sliver of fluid noted in the endometrial canal. No endometrial masses. Right ovary Measurements: 2.6 x 2.6 x 2.4 cm. Dominant simple appearing follicular cyst measuring 19 mm. Ovary otherwise unremarkable. No adnexal masses. Left ovary Measurements: 3.6 x 1.8 x 3.6 cm. Normal appearance/no adnexal mass. Other findings No abnormal free fluid. IMPRESSION: 1. Normal exam. No findings to account for right  lower quadrant abdominal pain. Electronically Signed   By: Amie Portland M.D.   On: 04/13/2017 16:35   US Pelvis Complete  Result Date: 04/13/2017 CLINICAL DATA:  Right lower quadrant pain for 2 days. History of a uterine ablation in January 2018. EXAM: TRANSABDOMINAL AND TRANSVAGINAL ULTRASOUND OF PELVIS TECHNIQUE: Both transabdominal and transvaginal ultrasound examinations of the pelvis were performed. Transabdominal technique was performed for global imaging of the pelvis including uterus, ovaries, adnexal regions, and pelvic cul-de-sac. It was necessary to proceed with endovaginal exam following the transabdominal exam to visualize the uterus, endometrium and ovaries to better advantage. COMPARISON:  None FINDINGS: Uterus Measurements: 8.6 x 3.4 x 4.5 cm. No fibroids or other mass visualized. Endometrium Thickness: 8 mm. Sliver of fluid noted in the endometrial canal. No endometrial masses. Right ovary Measurements: 2.6 x 2.6  x 2.4 cm. Dominant simple appearing follicular cyst measuring 19 mm. Ovary otherwise unremarkable. No adnexal masses. Left ovary Measurements: 3.6 x 1.8 x 3.6 cm. Normal appearance/no adnexal mass. Other findings No abnormal free fluid. IMPRESSION: 1. Normal exam. No findings to account for right lower quadrant abdominal pain. Electronically Signed   By: Amie Portland M.D.   On: 04/13/2017 16:35    No results found.  No results found.    Assessment and Plan: Patient Active Problem List   Diagnosis Date Noted  . Hives 06/10/2018  . Patellofemoral arthritis 05/13/2018  . Lymphedema 12/12/2017  . Chronic venous insufficiency 12/12/2017  . Class 1 obesity due to excess calories with body mass index (BMI) of 33.0 to 33.9 in adult 09/05/2016  . History of Graves' disease 09/05/2016  . Mood disorder (HCC) 09/05/2016  . GAD (generalized anxiety disorder) 11/13/2014   1. OSA (obstructive sleep apnea) CPAP titration study ordered for patient at this time to evaluate  appropriate pressure. - Cpap titration; Future  2. Excessive daytime sleepiness Patient continues to report excessive daytime sleepiness.  She reports she sleeps all night and feels like she has not slept at all.  3. Snoring Her partner continues to report loud snoring and patient reports she has woken herself up at times.  She generally wakes up with a slight headache and a dry mouth and throat.  4. Class 1 obesity due to excess calories without serious comorbidity with body mass index (BMI) of 33.0 to 33.9 in adult Obesity Counseling: Risk Assessment: An assessment of behavioral risk factors was made today and includes lack of exercise sedentary lifestyle, lack of portion control and poor dietary habits.  Risk Modification Advice: She was counseled on portion control guidelines. Restricting daily caloric intake to. . The detrimental long term effects of obesity on her health and ongoing poor compliance was also discussed with the patient.    General Counseling: I have discussed the findings of the evaluation and examination with Allison [name].  I have also discussed any further diagnostic evaluation thatmay be needed or ordered today. Allison [name] verbalizes understanding of the findings of todays visit. We also reviewed her medications today and discussed drug interactions and side effects including but not limited excessive drowsiness and altered mental states. We also discussed that there is always a risk not just to her but also people around her. she has been encouraged to call the office with any questions or concerns that should arise related to todays visit.    Time spent: 25 This patient was seen by Blima Ledger AGNP-C in Collaboration with Dr. Freda Munro as a part of collaborative care agreement.   I have personally obtained a history, examined the patient, evaluated laboratory and imaging results, formulated the assessment and plan and placed orders.    Yevonne Pax, MD [facility] Pulmonary  and Critical Care Sleep medicine

## 2019-02-16 NOTE — Procedures (Signed)
[facility] [address] [place], [place] [address]  Sleep Specialist: Yevonne Pax, MD [facility]  Home Sleep Study Interpretation  Patient Name: Allison [name] Patient MR [id number]:[id number] DOB:08-18-1976  Date of Study: February 06, 2019  Indications for study: Hypersomnia excessive sleepiness  BMI: 38.6 kg/m       Respiratory Data:  Total AHI: 5.1/h  Total Obstructive Apneas: 13  Total Central Apneas: 2  Total Mixed Apneas: 0  Total Hypopneas: 27  If the AHI is greater than 5 per hour patient qualifies for PAP evaluation  Oximetry Data:  Oxygen Desaturation Index: 5.5/h  Lowest Desaturation: 82%  Cardiac Data:  Minimum Heart Rate: 45  Maximum Heart Rate: 150   Impression / Diagnosis:  This apnea study shows presence of mild obstructive sleep apnea with an AHI of 5.1/h.  Patient also does have significant oxygen desaturations down to 82%.  Weight loss is strongly recommended.  CPAP titration should be considered to determine optimal pressure clinical correlation recommended  GENERAL Recommendations:  1.  Consider Auto PAP with pressure ranges 5-20 cmH20 with download, or facility based PAP Titration Study  2.  Consider PAP interface mask fitted for patient comfort, Heated Humidification & PAP compliance monitoring (1 month, 3 months & 12 months after PAP initiation)  3. Consider treatment with mandibular advancement splint (MAS) or referral to an ENT surgeon for modification to the upper airway if the patient prefers an alternate therapy or the PAP trial is unsuccessful  4. Sleep hygiene measures should be discussed with the patient  5. Behavioral therapy such as weight reduction or smoking cessation as appropriate for the patient  6. Advise patient against the use of alcohol or sedatives in so much as these substances can worsen excessive daytime sleepiness and respiratory disturbances of sleep  7. Advise patient against participating in  potentially dangerous activities while drowsy such as operating a motor vehicle, heavy equipment or power tools as it can put them and others in danger  8. Advise patient of the long term consequences of OSA if left untreated, need for treatment and close follow up  9. Clinical follow up as deemed necessary     This Level III home sleep study was performed using the [employer], a 4 channel screening device subject to limitations. Depending on actual total sleep time, not measured in this study, the AHI (sum of apneas and hypopneas/hr of sleep) and therefore the severity of sleep apnea may be underestimated. As with any single night study, including Level 1 attended PSG, severity of sleep apnea may also be underestimated due to the lack of supine and/or REM sleep.  The interpretation associated with this report is based on normal values and degrees of severity in accordance with AASM parameters and/or estimated from multiple sources in the literature for adults ages 12-80+. These may not agree with the displayed values. The patient's treating physician should use the interpretation and recommendations in conjunction with the overall clinical evaluation and treatment of the patient.  Some of the terminology used in this scored ApneaLink report was developed several years ago and may not always be in accordance with current nomenclature. This in no way affects the accuracy of the data or the reliability of the interpretation and recommendations.

## 2019-03-05 ENCOUNTER — Encounter: Payer: Self-pay | Admitting: Internal Medicine

## 2019-03-27 ENCOUNTER — Ambulatory Visit: Payer: Self-pay | Admitting: Internal Medicine

## 2019-06-18 ENCOUNTER — Encounter: Payer: Self-pay | Admitting: Family Medicine

## 2019-06-18 ENCOUNTER — Other Ambulatory Visit: Payer: Self-pay

## 2019-06-18 ENCOUNTER — Ambulatory Visit: Payer: 59 | Admitting: Family Medicine

## 2019-06-18 ENCOUNTER — Telehealth: Payer: Self-pay

## 2019-06-18 VITALS — BP 110/62 | HR 84 | Temp 98.7°F | Ht 64.0 in | Wt 214.0 lb

## 2019-06-18 DIAGNOSIS — K9049 Malabsorption due to intolerance, not elsewhere classified: Secondary | ICD-10-CM | POA: Insufficient documentation

## 2019-06-18 DIAGNOSIS — R5383 Other fatigue: Secondary | ICD-10-CM | POA: Diagnosis not present

## 2019-06-18 NOTE — Assessment & Plan Note (Addendum)
>  25 minutes spent in face to face time with patient, >50% spent in counselling or coordination of care. New over the past 2 months.  She denies dysphagia or difficulty swallowing (unlikely esophageal stricture), No abdominal pain, nausea or vomiting (does not seem consistent with biliary oolic). ? Meat allergy from lonestar tick but she doesn't remember a tick bite.  Will start out with labs and proceed with imaging from the point.  The patient indicates understanding of these issues and agrees with the plan.  Orders Placed This Encounter  Procedures  . Alpha-Gal Panel  . H. pylori breath test  . CBC with Differential/Platelet  . Comprehensive metabolic panel  . Hepatic function panel  . Lipase

## 2019-06-18 NOTE — Patient Instructions (Signed)
Great to see you. I will call you with your lab results from today and you can view them online.   

## 2019-06-18 NOTE — Telephone Encounter (Signed)
Spoke with patient today and followed up from conversation from 02/2019, patient awaiting on sleep study during covid-19 her insurance has approved cpap titration [id number] auth expires 08/25/2019 and she is aware and she will call back to schd the test prior to expiration date. Beth

## 2019-06-18 NOTE — Progress Notes (Signed)
Subjective:   Patient ID: Allison [name], female    DOB: 1976/03/02, 43 y.o.   MRN: [id number]  Allison [name] is a pleasant 43 y.o. year old female who presents to clinic today with GI Problem (Pt screened at vehicle. She is C/O having issues when she eats red meat. She does not remember being bitten by a tick.  She has no issues with any other meats. This has been going on x33months. Cannot drink much either. No changes in medications. Is fatigued.)  on 06/18/2019  HPI:  Dysphagia - For the past two  Months,  having issues when she eats red meat. She is not feeling like it is getting stuck.  She will take a few bites and it becomes unappetizing.  She does not remember being bitten by a tick. She has no issues with any other meats. T. No changes in medications. Is fatigued. Sometimes feels the same way with alcohol.  She has been very fatigued.  No rash, no HA, no abdominal pain.  No reflux symptoms.  No nausea or vomiting.   Current Outpatient Medications on File Prior to Visit  Medication Sig Dispense Refill  . clonazePAM (KLONOPIN) 0.5 MG tablet Take 1 tablet (0.5 mg total) by mouth daily as needed for anxiety. 30 tablet 0  . hydrOXYzine (VISTARIL) 50 MG capsule Take 50 mg by mouth 3 (three) times daily as needed.    . lamoTRIgine (LAMICTAL) 200 MG tablet TK 1 T PO QAM    . VRAYLAR 4.5 MG CAPS Take 1 capsule by mouth daily.      No current facility-administered medications on file prior to visit.     No Known Allergies  Past Medical History:  Diagnosis Date  . Anxiety   . Bipolar disorder, in full remission, most recent episode hypomanic ([place])   . Depression   . Insomnia   . Neuropathy   . Thyroid disease     Past Surgical History:  Procedure Laterality Date  . APPENDECTOMY    . TUBAL LIGATION      Family History  Problem Relation Age of Onset  . Thyroid disease Mother        thyroid removed at age 14  . Depression Mother   . Arthritis Father   .  Hypertension Father   . Cancer Maternal Grandmother 85       breast  . Depression Maternal Aunt   . Neuropathy Neg Hx   . Migraines Neg Hx     Social History   Socioeconomic History  . Marital status: Married    Spouse name: Not on file  . Number of children: Not on file  . Years of education: Not on file  . Highest education level: Not on file  Occupational History  . Not on file  Social Needs  . Financial resource strain: Not on file  . Food insecurity    Worry: Not on file    Inability: Not on file  . Transportation needs    Medical: Not on file    Non-medical: Not on file  Tobacco Use  . Smoking status: Former Smoker    Packs/day: 0.50    Years: 5.00    Pack years: 2.50    Types: Cigarettes    Quit date: 12/05/2007    Years since quitting: 11.5  . Smokeless tobacco: Never Used  Substance and Sexual Activity  . Alcohol use: Yes    Comment: occ, 1-2 a week  . Drug use:  No  . Sexual activity: Yes    Birth control/protection: Surgical  Lifestyle  . Physical activity    Days per week: Not on file    Minutes per session: Not on file  . Stress: Not on file  Relationships  . Social Musicianconnections    Talks on phone: Not on file    Gets together: Not on file    Attends religious service: Not on file    Active member of club or organization: Not on file    Attends meetings of clubs or organizations: Not on file    Relationship status: Not on file  . Intimate partner violence    Fear of current or ex partner: Not on file    Emotionally abused: Not on file    Physically abused: Not on file    Forced sexual activity: Not on file  Other Topics Concern  . Not on file  Social History Narrative  . Not on file   The PMH, PSH, Social History, Family History, Medications, and allergies [facility], and have been updated if relevant.  Review of Systems  Constitutional: Positive for appetite change and fatigue. Negative for fever and unexpected weight change.   HENT: Negative.   Cardiovascular: Negative.   Gastrointestinal: Negative for abdominal distention, abdominal pain, anal bleeding, blood in stool, constipation, diarrhea, nausea, rectal pain and vomiting.  Skin: Negative.   Neurological: Negative.   Psychiatric/Behavioral: Negative.   All other systems reviewed and are negative.      Objective:    BP 110/62 (BP Location: Left Arm, Patient Position: Sitting, Cuff Size: Normal)   Pulse 84   Temp 98.7 F (37.1 C) (Oral)   Ht 5\' 4"  (1.626 m)   Wt 214 lb (97.1 kg)   SpO2 98%   BMI 36.73 kg/m   Wt Readings from Last 3 Encounters:  06/18/19 214 lb (97.1 kg)  02/11/19 213 lb (96.6 kg)  01/20/19 215 lb (97.5 kg)    Physical Exam Vitals signs and nursing note reviewed.  Constitutional:      General: She is not in acute distress.    Appearance: Normal appearance. She is obese. She is not ill-appearing.  HENT:     Head: Normocephalic.     Right Ear: External ear normal.     Left Ear: External ear normal.     Nose: Nose normal.     Mouth/Throat:     Mouth: Mucous membranes are moist.  Eyes:     Extraocular Movements: Extraocular movements intact.  Cardiovascular:     Pulses: Normal pulses.  Pulmonary:     Effort: Pulmonary effort is normal.     Breath sounds: Normal breath sounds.  Abdominal:     General: There is no distension.     Palpations: There is no mass.     Tenderness: There is abdominal tenderness. There is no right CVA tenderness, left CVA tenderness, guarding or rebound.     Hernia: No hernia is present.  Musculoskeletal: Normal range of motion.        General: No swelling.  Skin:    General: Skin is warm.     Coloration: Skin is not jaundiced or pale.     Findings: No erythema or rash.  Neurological:     General: No focal deficit present.     Mental Status: She is alert. Mental status is at baseline.     Cranial Nerves: No cranial nerve deficit.     Motor: No  weakness.     Gait: Gait normal.   Psychiatric:        Mood and Affect: Mood normal.        Behavior: Behavior normal.        Thought Content: Thought content normal.        Judgment: Judgment normal.     .      Assessment & Plan:   Food intolerance - Plan: Alpha-Gal Panel, H. pylori breath test, CBC with Differential/Platelet, Comprehensive metabolic panel, Hepatic function panel, Lipase, CANCELED: Comprehensive metabolic panel, CANCELED: Lipase, CANCELED: Hepatic function panel, CANCELED: CBC with Differential/Platelet,   Fatigue, unspecified type - Plan: H. pylori breath test, Comprehensive metabolic panel, CANCELED: Comprehensive metabolic panel,  No follow-ups on file.

## 2019-06-19 ENCOUNTER — Other Ambulatory Visit: Payer: Self-pay | Admitting: Family Medicine

## 2019-06-19 MED ORDER — OMEPRAZOLE 20 MG PO CPDR: 20.0000 mg | DELAYED_RELEASE_CAPSULE | Freq: Two times a day (BID) | ORAL | 0 refills | Status: DC

## 2019-06-19 MED ORDER — CLARITHROMYCIN 500 MG PO TABS: 500.0000 mg | ORAL_TABLET | Freq: Two times a day (BID) | ORAL | 0 refills | Status: AC

## 2019-06-19 MED ORDER — FLUCONAZOLE 150 MG PO TABS: 150.0000 mg | ORAL_TABLET | Freq: Once | ORAL | 3 refills | Status: AC

## 2019-06-19 MED ORDER — AMOXICILLIN 500 MG PO CAPS: 1000.0000 mg | ORAL_CAPSULE | Freq: Two times a day (BID) | ORAL | 0 refills | Status: AC

## 2019-06-20 ENCOUNTER — Telehealth: Payer: Self-pay | Admitting: Family Medicine

## 2019-06-20 ENCOUNTER — Encounter: Payer: Self-pay | Admitting: Family Medicine

## 2019-06-20 NOTE — Telephone Encounter (Signed)
Patient stated that pharmacy still needs prior authorization for her medication. Patient was told by pharmacy to contact office. Patient would like a callback once this is done.  omeprazole (PRILOSEC) 20 MG capsule   [id number] Allison [name], [name] AT [address] [phone] (Phone) [phone] (Fax)

## 2019-06-24 ENCOUNTER — Telehealth: Payer: Self-pay

## 2019-06-24 LAB — COMPREHENSIVE METABOLIC PANEL
AG Ratio: 1.4 (calc) (ref 1.0–2.5)
ALT: 16 U/L (ref 6–29)
AST: 15 U/L (ref 10–30)
Albumin: 4.5 g/dL (ref 3.6–5.1)
Alkaline phosphatase (APISO): 105 U/L (ref 31–125)
BUN: 12 mg/dL (ref 7–25)
CO2: 28 mmol/L (ref 20–32)
Calcium: 9.9 mg/dL (ref 8.6–10.2)
Chloride: 104 mmol/L (ref 98–110)
Creat: 0.97 mg/dL (ref 0.50–1.10)
Globulin: 3.3 g/dL (calc) (ref 1.9–3.7)
Glucose, Bld: 92 mg/dL (ref 65–99)
Potassium: 5.1 mmol/L (ref 3.5–5.3)
Sodium: 141 mmol/L (ref 135–146)
Total Bilirubin: 0.5 mg/dL (ref 0.2–1.2)
Total Protein: 7.8 g/dL (ref 6.1–8.1)

## 2019-06-24 LAB — CBC WITH DIFFERENTIAL/PLATELET
Absolute Monocytes: 496 cells/uL (ref 200–950)
Basophils Absolute: 120 cells/uL (ref 0–200)
Basophils Relative: 1.5 %
Eosinophils Absolute: 248 cells/uL (ref 15–500)
Eosinophils Relative: 3.1 %
HCT: 44.4 % (ref 35.0–45.0)
Hemoglobin: 15.2 g/dL (ref 11.7–15.5)
Lymphs Abs: 3000 cells/uL (ref 850–3900)
MCH: 30.6 pg (ref 27.0–33.0)
MCHC: 34.2 g/dL (ref 32.0–36.0)
MCV: 89.3 fL (ref 80.0–100.0)
MPV: 11 fL (ref 7.5–12.5)
Monocytes Relative: 6.2 %
Neutro Abs: 4136 cells/uL (ref 1500–7800)
Neutrophils Relative %: 51.7 %
Platelets: 344 10*3/uL (ref 140–400)
RBC: 4.97 10*6/uL (ref 3.80–5.10)
RDW: 12.5 % (ref 11.0–15.0)
Total Lymphocyte: 37.5 %
WBC: 8 10*3/uL (ref 3.8–10.8)

## 2019-06-24 LAB — ALPHA-GAL PANEL
Beef IgE: <0.1 kU/L (ref <0.35)
Class: 0
Class: 0
Class: 0
Galactose-alpha-1,3-galactose IgE: <0.1 kU/L (ref <0.10)
LAMB/MUTTON IGE: <0.1 kU/L (ref <0.35)
Pork IgE: <0.1 kU/L (ref <0.35)

## 2019-06-24 LAB — HEPATIC FUNCTION PANEL
AG Ratio: 1.4 (calc) (ref 1.0–2.5)
ALT: 16 U/L (ref 6–29)
AST: 15 U/L (ref 10–30)
Albumin: 4.5 g/dL (ref 3.6–5.1)
Alkaline phosphatase (APISO): 105 U/L (ref 31–125)
Bilirubin, Direct: 0.1 mg/dL (ref 0.0–0.2)
Globulin: 3.3 g/dL (calc) (ref 1.9–3.7)
Indirect Bilirubin: 0.4 mg/dL (calc) (ref 0.2–1.2)
Total Bilirubin: 0.5 mg/dL (ref 0.2–1.2)
Total Protein: 7.8 g/dL (ref 6.1–8.1)

## 2019-06-24 LAB — LIPASE: Lipase: 15 U/L (ref 7–60)

## 2019-06-24 LAB — H. PYLORI BREATH TEST: H. pylori Breath Test: DETECTED — AB

## 2019-06-24 NOTE — Telephone Encounter (Signed)
PA started. Waiting on results to be faxed over.

## 2019-06-24 NOTE — Telephone Encounter (Signed)
PA has been started waiting on results to be faxed to office.

## 2019-06-26 NOTE — Telephone Encounter (Signed)
PA approved will let pharmacy know. Pt aware.

## 2019-12-09 ENCOUNTER — Encounter: Payer: Self-pay | Admitting: Family Medicine

## 2019-12-10 NOTE — Progress Notes (Signed)
Virtual Visit via Video   Due to the COVID-19 pandemic, this visit was completed with telemedicine (audio/video) technology to reduce patient and provider exposure as well as to preserve personal protective equipment.   I connected with Myriam Jacobson by a video enabled telemedicine application and verified that I am speaking with the correct person using two identifiers. Location patient: Home Location provider: [facility], Office Persons participating in the virtual visit: Nevea, Spiewak, MD   I discussed the limitations of evaluation and management by telemedicine and the availability of in person appointments. The patient expressed understanding and agreed to proceed.  Care Team   Patient Care Team: Dianne Dun, MD as PCP - General (Family Medicine)  Subjective:   HPI:    Patient sent the following my chart message yesterday:   "Last time I was there, over the summer, I was diagnosed with H Pylori. I was wondering if that's something that can come back this quickly. I'm having the same aversion to meat and often don't feel like eating. And I'm exhausted all the time. I didn't know if there's anything I can do. Thanks for your help."   Had similar symptoms in 06/2019- h pylori was positive- the aversion to meet and fatigue resolved after we treated her for h pylori. Review of Systems  Constitutional: Positive for malaise/fatigue. Negative for chills, diaphoresis, fever and weight loss.  HENT: Negative.  Negative for congestion and hearing loss.   Eyes: Negative.  Negative for blurred vision, discharge and redness.  Respiratory: Negative.  Negative for cough and shortness of breath.   Cardiovascular: Negative.  Negative for chest pain, palpitations and leg swelling.  Gastrointestinal: Negative.  Negative for abdominal pain, blood in stool, constipation, diarrhea, heartburn, melena, nausea and vomiting.  Genitourinary: Negative.  Negative for dysuria.   Musculoskeletal: Negative.  Negative for falls.  Skin: Negative.  Negative for rash.  Neurological: Negative.  Negative for loss of consciousness and headaches.  Endo/Heme/Allergies: Negative.  Does not bruise/bleed easily.  Psychiatric/Behavioral: Negative.  Negative for depression.  All other systems reviewed and are negative.    Patient Active Problem List   Diagnosis Date Noted  . Food intolerance 06/18/2019  . Fatigue 06/18/2019  . Hives 06/10/2018  . Patellofemoral arthritis 05/13/2018  . Lymphedema 12/12/2017  . Chronic venous insufficiency 12/12/2017  . Class 1 obesity due to excess calories with body mass index (BMI) of 33.0 to 33.9 in adult 09/05/2016  . History of Graves' disease 09/05/2016  . Mood disorder (HCC) 09/05/2016  . GAD (generalized anxiety disorder) 11/13/2014    Social History   Tobacco Use  . Smoking status: Former Smoker    Packs/day: 0.50    Years: 5.00    Pack years: 2.50    Types: Cigarettes    Quit date: 12/05/2007    Years since quitting: 12.0  . Smokeless tobacco: Never Used  Substance Use Topics  . Alcohol use: Yes    Comment: occ, 1-2 a week    Current Outpatient Medications:  .  clonazePAM (KLONOPIN) 0.5 MG tablet, Take 1 tablet (0.5 mg total) by mouth daily as needed for anxiety., Disp: 30 tablet, Rfl: 0 .  hydrOXYzine (VISTARIL) 50 MG capsule, Take 50 mg by mouth 3 (three) times daily as needed., Disp: , Rfl:  .  lamoTRIgine (LAMICTAL) 200 MG tablet, TK 1 T PO QAM, Disp: , Rfl:  .  omeprazole (PRILOSEC) 20 MG capsule, Take 1 capsule (20 mg total) by  mouth 2 (two) times daily before a meal for 14 days., Disp: 28 capsule, Rfl: 0 .  VRAYLAR 4.5 MG CAPS, Take 1 capsule by mouth daily. , Disp: , Rfl:   No Known Allergies  Objective:  Wt 217 lb (98.4 kg)   BMI 37.25 kg/m   VITALS: Per patient if applicable, see vitals. GENERAL: Alert, appears well and in no acute distress. HEENT: Atraumatic, conjunctiva clear, no obvious  abnormalities on inspection of external nose and ears. NECK: Normal movements of the head and neck. CARDIOPULMONARY: No increased WOB. Speaking in clear sentences. I:E ratio WNL.  MS: Moves all visible extremities without noticeable abnormality. PSYCH: Pleasant and cooperative, well-groomed. Speech normal rate and rhythm. Affect is appropriate. Insight and judgement are appropriate. Attention is focused, linear, and appropriate.  NEURO: CN grossly intact. Oriented as arrived to appointment on time with no prompting. Moves both UE equally.  SKIN: No obvious lesions, wounds, erythema, or cyanosis noted on face or hands.  Depression screen [facility] 2/9 01/20/2019 05/11/2017 09/05/2016  Decreased Interest 0 3 1  Down, Depressed, Hopeless 0 3 1  PHQ - 2 Score 0 6 2  Altered sleeping - 3 -  Tired, decreased energy - 3 -  Change in appetite - 3 -  Feeling bad or failure about yourself  - 3 -  Trouble concentrating - 3 -  Moving slowly or fidgety/restless - 0 -  Suicidal thoughts - 0 -  PHQ-9 Score - 21 -  Difficult doing work/chores - Very difficult -     . COVID-19 Education: The signs and symptoms of COVID-19 were discussed with the patient and how to seek care for testing if needed. The importance of social distancing was discussed today. . Reviewed expectations re: course of current medical issues. . Discussed self-management of symptoms. . Outlined signs and symptoms indicating need for more acute intervention. . Patient verbalized understanding and all questions were answered. Marland Kitchen Health Maintenance issues including appropriate healthy diet, exercise, and smoking avoidance were discussed with patient. . See orders for this visit as documented in the electronic medical record.  Arnette Norris, MD  Records requested if needed. Time spent: 25 minutes, of which >50% was spent in obtaining information about her symptoms, reviewing her previous labs, evaluations, and treatments, counseling her about her  condition (please see the discussed topics above), and developing a plan to further investigate it; she had a number of questions which I addressed.   Lab Results  Component Value Date   WBC 8.0 06/18/2019   HGB 15.2 06/18/2019   HCT 44.4 06/18/2019   PLT 344 06/18/2019   GLUCOSE 92 06/18/2019   CHOL 184 01/02/2018   TRIG 200.0 (H) 01/02/2018   HDL 38.20 (L) 01/02/2018   LDLCALC 106 (H) 01/02/2018   ALT 16 06/18/2019   ALT 16 06/18/2019   AST 15 06/18/2019   AST 15 06/18/2019   NA 141 06/18/2019   K 5.1 06/18/2019   CL 104 06/18/2019   CREATININE 0.97 06/18/2019   BUN 12 06/18/2019   CO2 28 06/18/2019   TSH 2.36 01/02/2018   HGBA1C 5.4 05/22/2016    Lab Results  Component Value Date   TSH 2.36 01/02/2018   Lab Results  Component Value Date   WBC 8.0 06/18/2019   HGB 15.2 06/18/2019   HCT 44.4 06/18/2019   MCV 89.3 06/18/2019   PLT 344 06/18/2019   Lab Results  Component Value Date   NA 141 06/18/2019   K 5.1  06/18/2019   CO2 28 06/18/2019   GLUCOSE 92 06/18/2019   BUN 12 06/18/2019   CREATININE 0.97 06/18/2019   BILITOT 0.5 06/18/2019   BILITOT 0.5 06/18/2019   ALKPHOS 70 01/02/2018   AST 15 06/18/2019   AST 15 06/18/2019   ALT 16 06/18/2019   ALT 16 06/18/2019   PROT 7.8 06/18/2019   PROT 7.8 06/18/2019   ALBUMIN 4.2 01/02/2018   CALCIUM 9.9 06/18/2019   GFR 84.39 01/02/2018   Lab Results  Component Value Date   CHOL 184 01/02/2018   Lab Results  Component Value Date   HDL 38.20 (L) 01/02/2018   Lab Results  Component Value Date   LDLCALC 106 (H) 01/02/2018   Lab Results  Component Value Date   TRIG 200.0 (H) 01/02/2018   Lab Results  Component Value Date   CHOLHDL 5 01/02/2018   Lab Results  Component Value Date   HGBA1C 5.4 05/22/2016       Assessment & Plan:   Problem List Items Addressed This Visit      Active Problems   Fatigue - Primary    With aversion to meat, almost identical to when she had H pylori.  Fatigue and  aversion to meet stopped when we treated her for it. We agreed to get a stool antigen test ofr H pylori prior to trying additional work up given her response in the past to treatment.  Placed on lab visit schedule for today.  The patient indicates understanding of these issues and agrees with the plan.       Relevant Orders   Helicobacter pylori special antigen (stool)    Other Visit Diagnoses    History of Helicobacter pylori infection       Relevant Orders   Helicobacter pylori special antigen (stool)      I am having [name] maintain her clonazePAM, Vraylar, hydrOXYzine, lamoTRIgine, and omeprazole.  No orders of the defined types were placed in this encounter.    Ruthe Mannan, MD

## 2019-12-11 ENCOUNTER — Encounter: Payer: Self-pay | Admitting: Family Medicine

## 2019-12-11 ENCOUNTER — Other Ambulatory Visit (INDEPENDENT_AMBULATORY_CARE_PROVIDER_SITE_OTHER): Payer: 59

## 2019-12-11 ENCOUNTER — Telehealth (INDEPENDENT_AMBULATORY_CARE_PROVIDER_SITE_OTHER): Payer: 59 | Admitting: Family Medicine

## 2019-12-11 ENCOUNTER — Other Ambulatory Visit: Payer: Self-pay

## 2019-12-11 VITALS — Wt 217.0 lb

## 2019-12-11 DIAGNOSIS — Z8619 Personal history of other infectious and parasitic diseases: Secondary | ICD-10-CM

## 2019-12-11 DIAGNOSIS — R5383 Other fatigue: Secondary | ICD-10-CM

## 2019-12-11 NOTE — Assessment & Plan Note (Signed)
With aversion to meat, almost identical to when she had H pylori.  Fatigue and aversion to meet stopped when we treated her for it. We agreed to get a stool antigen test ofr H pylori prior to trying additional work up given her response in the past to treatment.  Placed on lab visit schedule for today.  The patient indicates understanding of these issues and agrees with the plan.

## 2019-12-16 LAB — HELICOBACTER PYLORI  SPECIAL ANTIGEN
MICRO NUMBER:: 10022641
SPECIMEN QUALITY: ADEQUATE

## 2019-12-17 ENCOUNTER — Encounter: Payer: Self-pay | Admitting: Family Medicine

## 2020-06-02 ENCOUNTER — Telehealth: Payer: Self-pay | Admitting: General Practice

## 2020-06-03 ENCOUNTER — Other Ambulatory Visit: Payer: Self-pay

## 2020-06-03 ENCOUNTER — Ambulatory Visit: Payer: 59 | Admitting: Family Medicine

## 2020-06-03 ENCOUNTER — Encounter: Payer: Self-pay | Admitting: Family Medicine

## 2020-06-03 VITALS — BP 102/68 | HR 68 | Ht 64.0 in | Wt 206.0 lb

## 2020-06-03 DIAGNOSIS — F3170 Bipolar disorder, currently in remission, most recent episode unspecified: Secondary | ICD-10-CM | POA: Diagnosis not present

## 2020-06-03 DIAGNOSIS — K219 Gastro-esophageal reflux disease without esophagitis: Secondary | ICD-10-CM

## 2020-06-03 DIAGNOSIS — F5104 Psychophysiologic insomnia: Secondary | ICD-10-CM | POA: Diagnosis not present

## 2020-06-03 DIAGNOSIS — G4700 Insomnia, unspecified: Secondary | ICD-10-CM | POA: Insufficient documentation

## 2020-06-03 MED ORDER — OMEPRAZOLE 20 MG PO CPDR: 20.0000 mg | DELAYED_RELEASE_CAPSULE | Freq: Every day | ORAL | 3 refills | Status: DC

## 2020-06-03 NOTE — Patient Instructions (Signed)
Start medication Send mychart in 2 weeks to update If working continue for 8 weeks total  Take on an empty stomach and eat 30-60 minutes later

## 2020-06-03 NOTE — Assessment & Plan Note (Signed)
Rare use of trazodone. Follows with psychiatry.

## 2020-06-03 NOTE — Progress Notes (Signed)
Subjective:     Allison [name] is a 44 y.o. female presenting for Nausea (trouble eating)     HPI  # Nausea - no vomiting - had h pylori in the past and had ongoing symptoms - could not eat meat because it would cause her to feel nauseas - last night had symptoms with chicken - the morning doesn't eat - yesterday only ate a few bits - weight down 10 lbs since Jan - though hasn't noticed this - treated the h pylori and was gone on repeat - lunch rice cake  -   Review of Systems   Social History   Tobacco Use  Smoking Status Former Smoker  . Packs/day: 0.50  . Years: 5.00  . Pack years: 2.50  . Types: Cigarettes  . Quit date: 12/05/2007  . Years since quitting: 12.5  Smokeless Tobacco Never Used        Objective:    BP Readings from Last 3 Encounters:  06/03/20 102/68  06/18/19 110/62  02/11/19 92/68   Wt Readings from Last 3 Encounters:  06/03/20 206 lb (93.4 kg)  12/11/19 217 lb (98.4 kg)  06/18/19 214 lb (97.1 kg)    BP 102/68   Pulse 68   Ht 5\' 4"  (1.626 m)   Wt 206 lb (93.4 kg)   SpO2 97%   BMI 35.36 kg/m    Physical Exam Constitutional:      General: She is not in acute distress.    Appearance: She is well-developed. She is not diaphoretic.  HENT:     Right Ear: External ear normal.     Left Ear: External ear normal.     Nose: Nose normal.  Eyes:     Conjunctiva/sclera: Conjunctivae normal.  Cardiovascular:     Rate and Rhythm: Normal rate and regular rhythm.     Heart sounds: No murmur heard.   Pulmonary:     Effort: Pulmonary effort is normal. No respiratory distress.     Breath sounds: Normal breath sounds. No wheezing.  Abdominal:     General: Abdomen is flat. Bowel sounds are normal. There is no distension.     Palpations: Abdomen is soft.     Tenderness: There is no abdominal tenderness. There is no guarding or rebound. Negative signs include Murphy's sign.     Hernia: No hernia is present.  Musculoskeletal:      Cervical back: Neck supple.  Skin:    General: Skin is warm and dry.     Capillary Refill: Capillary refill takes less than 2 seconds.  Neurological:     Mental Status: She is alert. Mental status is at baseline.  Psychiatric:        Mood and Affect: Mood normal.        Behavior: Behavior normal.           Assessment & Plan:   Problem List Items Addressed This Visit      Digestive   Gastroesophageal reflux disease - Primary    Pt with hx of hpylori infection with repeat testing negative, however, with ongoing nausea and food aversion. Suspect this may be lingering reflux/gastritis symptoms. Advised trial of PPI. If no improvement will refer to GI. Has experienced weight loss, however, suspect this is due to lack of caloric intake.       Relevant Medications   omeprazole (PRILOSEC) 20 MG capsule     Other   Morbid obesity (HCC)    BMI >35 with bipolar disorder  and GERD. She has lost weight in setting of current stomach issues. Encouraged finding ways to exercise regularly with family.       Bipolar disorder (HCC)    Follows with psychiatry. Doing well on current medications.       Insomnia    Rare use of trazodone. Follows with psychiatry.           Return for Mychart in 2 weeks.  Lynnda Child, MD  This visit occurred during the SARS-CoV-2 public health emergency.  Safety protocols were in place, including screening questions prior to the visit, additional usage of staff PPE, and extensive cleaning of exam room while observing appropriate contact time as indicated for disinfecting solutions.

## 2020-06-03 NOTE — Assessment & Plan Note (Signed)
Follows with psychiatry. Doing well on current medications.

## 2020-06-03 NOTE — Assessment & Plan Note (Signed)
BMI >35 with bipolar disorder and GERD. She has lost weight in setting of current stomach issues. Encouraged finding ways to exercise regularly with family.

## 2020-06-03 NOTE — Assessment & Plan Note (Signed)
Pt with hx of hpylori infection with repeat testing negative, however, with ongoing nausea and food aversion. Suspect this may be lingering reflux/gastritis symptoms. Advised trial of PPI. If no improvement will refer to GI. Has experienced weight loss, however, suspect this is due to lack of caloric intake.

## 2020-06-18 ENCOUNTER — Encounter: Payer: Self-pay | Admitting: Family Medicine

## 2020-06-18 DIAGNOSIS — Z8619 Personal history of other infectious and parasitic diseases: Secondary | ICD-10-CM

## 2020-06-18 DIAGNOSIS — K9049 Malabsorption due to intolerance, not elsewhere classified: Secondary | ICD-10-CM

## 2020-06-30 NOTE — Telephone Encounter (Signed)
error 

## 2020-08-03 ENCOUNTER — Encounter: Payer: 59 | Admitting: Family Medicine

## 2020-09-08 ENCOUNTER — Ambulatory Visit: Payer: 59 | Admitting: Gastroenterology

## 2020-09-16 ENCOUNTER — Encounter: Payer: Self-pay | Admitting: Family Medicine

## 2020-10-07 ENCOUNTER — Other Ambulatory Visit: Payer: Self-pay

## 2020-10-07 ENCOUNTER — Ambulatory Visit: Payer: 59 | Admitting: Family Medicine

## 2020-10-07 ENCOUNTER — Encounter: Payer: Self-pay | Admitting: Family Medicine

## 2020-10-07 VITALS — BP 120/80 | HR 73 | Temp 97.7°F | Wt 195.0 lb

## 2020-10-07 DIAGNOSIS — F3170 Bipolar disorder, currently in remission, most recent episode unspecified: Secondary | ICD-10-CM

## 2020-10-07 DIAGNOSIS — Z23 Encounter for immunization: Secondary | ICD-10-CM

## 2020-10-07 DIAGNOSIS — M1710 Unilateral primary osteoarthritis, unspecified knee: Secondary | ICD-10-CM

## 2020-10-07 NOTE — Progress Notes (Signed)
Subjective:     Allison [name] is a 44 y.o. female presenting for Knee Pain (R x 1 week "heard lots of pops when stepping down step")     Knee Pain  The incident occurred more than 1 week ago. The injury mechanism was an eversion injury. The pain is present in the right knee. The quality of the pain is described as cramping and aching. The pain is moderate. The pain has been constant since onset. Pertinent negatives include no inability to bear weight, loss of sensation, numbness or tingling. She reports no foreign bodies present. Nothing aggravates the symptoms. She has tried ice (pencid ) for the symptoms. The treatment provided mild relief.     Review of Systems  Neurological: Negative for tingling and numbness.     Social History   Tobacco Use  Smoking Status Former Smoker  . Packs/day: 0.50  . Years: 5.00  . Pack years: 2.50  . Types: Cigarettes  . Quit date: 12/05/2007  . Years since quitting: 12.8  Smokeless Tobacco Never Used        Objective:    BP Readings from Last 3 Encounters:  10/07/20 120/80  06/03/20 102/68  06/18/19 110/62   Wt Readings from Last 3 Encounters:  10/07/20 195 lb (88.5 kg)  06/03/20 206 lb (93.4 kg)  12/11/19 217 lb (98.4 kg)    BP 120/80   Pulse 73   Temp 97.7 F (36.5 C) (Temporal)   Wt 195 lb (88.5 kg)   SpO2 99%   BMI 33.47 kg/m    Physical Exam Constitutional:      General: She is not in acute distress.    Appearance: She is well-developed. She is not diaphoretic.  HENT:     Right Ear: External ear normal.     Left Ear: External ear normal.  Eyes:     Conjunctiva/sclera: Conjunctivae normal.  Cardiovascular:     Rate and Rhythm: Normal rate.  Pulmonary:     Effort: Pulmonary effort is normal.  Musculoskeletal:     Cervical back: Neck supple.     Comments: Right knee:  Inspection: mild swelling compared to left Palpation: lateral joint line ttp, otherwise no ttp ROM: hyperextension on the left, normal  right extension/flexion Strength: normal Ligaments: normal  Neg meniscus testing  Skin:    General: Skin is warm and dry.     Capillary Refill: Capillary refill takes less than 2 seconds.  Neurological:     Mental Status: She is alert. Mental status is at baseline.  Psychiatric:        Mood and Affect: Mood normal.        Behavior: Behavior normal.           Assessment & Plan:   Problem List Items Addressed This Visit      Musculoskeletal and Integument   Patellofemoral arthritis    Reassuring knee exam. Hx of similar symptoms. Home exercise program and ice/nsaids. If not improving she will reach out for PT referral.         Other   Bipolar disorder (HCC)    Doing well on Vraylar 4.5 mg and lamotrigine 200 mg daily. Rare use of clonazepam 0.5 mg. Cont medications. She will reach out when due for refills.        Other Visit Diagnoses    Need for influenza vaccination    -  Primary   Relevant Orders   Flu Vaccine QUAD 36+ mos IM (Completed)  Return if symptoms worsen or fail to improve.  Lesleigh Noe, MD  This visit occurred during the SARS-CoV-2 public health emergency.  Safety protocols were in place, including screening questions prior to the visit, additional usage of staff PPE, and extensive cleaning of exam room while observing appropriate contact time as indicated for disinfecting solutions.

## 2020-10-07 NOTE — Assessment & Plan Note (Signed)
Reassuring knee exam. Hx of similar symptoms. Home exercise program and ice/nsaids. If not improving she will reach out for PT referral.

## 2020-10-07 NOTE — Patient Instructions (Signed)
Do the exercises If not improving - can do physical therapy referral  Continue anti-inflammatory medication like ibuprofen as needed for pain

## 2020-10-07 NOTE — Assessment & Plan Note (Signed)
Doing well on Vraylar 4.5 mg and lamotrigine 200 mg daily. Rare use of clonazepam 0.5 mg. Cont medications. She will reach out when due for refills.

## 2020-10-12 ENCOUNTER — Ambulatory Visit: Payer: 59 | Admitting: Family Medicine

## 2020-12-09 LAB — RESULTS CONSOLE HPV
CHL HPV: NEGATIVE
CHL HPV: NEGATIVE

## 2020-12-09 LAB — HM PAP SMEAR: HM Pap smear: NEGATIVE

## 2021-01-12 ENCOUNTER — Encounter: Payer: Self-pay | Admitting: Family Medicine

## 2021-01-12 ENCOUNTER — Other Ambulatory Visit: Payer: Self-pay

## 2021-01-12 DIAGNOSIS — F3170 Bipolar disorder, currently in remission, most recent episode unspecified: Secondary | ICD-10-CM

## 2021-01-12 MED ORDER — LAMOTRIGINE 200 MG PO TABS
ORAL_TABLET | ORAL | 1 refills | Status: DC
Start: 2021-01-12 — End: 2021-10-07

## 2021-01-12 MED ORDER — VRAYLAR 4.5 MG PO CAPS
1.0000 | ORAL_CAPSULE | Freq: Every day | ORAL | 1 refills | Status: DC
Start: 2021-01-12 — End: 2021-01-24

## 2021-01-12 NOTE — Telephone Encounter (Signed)
Ok to refill as no changes

## 2021-01-24 MED ORDER — CARIPRAZINE HCL 3 MG PO CAPS: 3.0000 mg | ORAL_CAPSULE | Freq: Every day | ORAL | 1 refills | Status: DC

## 2021-01-24 NOTE — Addendum Note (Signed)
Addended by: Sherrie George on: 01/24/2021 03:44 PM   Modules accepted: Orders

## 2021-01-24 NOTE — Telephone Encounter (Signed)
Per Dr. Selena Batten, ok to send in 3mg  dose.  Sent in prescription for Vraylar 3mg  to [facility] pharmacy.    Advised pt of new script and that 4.5mg  dose has been d/c. Pt verbalized understanding.

## 2021-01-24 NOTE — Telephone Encounter (Signed)
Pharmacist at [facility] left a voicemail stating that a script was sent in for Vraylar 4.5 mg and the patient told them it was suppose to be 3 mg. [facility] requested a call back to confirm the correct dose that they need to fill.

## 2021-07-31 ENCOUNTER — Other Ambulatory Visit: Payer: Self-pay | Admitting: Family Medicine

## 2021-07-31 DIAGNOSIS — F3170 Bipolar disorder, currently in remission, most recent episode unspecified: Secondary | ICD-10-CM

## 2021-08-01 NOTE — Telephone Encounter (Signed)
Refill provided. She can either schedule an in the next 1-2 months or before next refill

## 2021-08-01 NOTE — Telephone Encounter (Signed)
Last refilled on 2/21/222 #90 with 1 refill LOV 10/07/20 knee pain, bipolar disorder.  No future appointments scheduled.

## 2021-10-05 ENCOUNTER — Telehealth: Payer: 59 | Admitting: Physician Assistant

## 2021-10-05 DIAGNOSIS — J111 Influenza due to unidentified influenza virus with other respiratory manifestations: Secondary | ICD-10-CM | POA: Diagnosis not present

## 2021-10-05 MED ORDER — OSELTAMIVIR PHOSPHATE 75 MG PO CAPS: 75.0000 mg | ORAL_CAPSULE | Freq: Two times a day (BID) | ORAL | 0 refills | Status: DC

## 2021-10-05 NOTE — Patient Instructions (Signed)
Myriam Jacobson, thank you for joining Margaretann Loveless, PA-C for today's virtual visit.  While this provider is not your primary care provider (PCP), if your PCP is located in our provider database this encounter information will be shared with them immediately following your visit.  Consent: (Patient) Madelena Maturin provided verbal consent for this virtual visit at the beginning of the encounter.  Current Medications:  Current Outpatient Medications:    oseltamivir (TAMIFLU) 75 MG capsule, Take 1 capsule (75 mg total) by mouth 2 (two) times daily., Disp: 10 capsule, Rfl: 0   clonazePAM (KLONOPIN) 0.5 MG tablet, Take 1 tablet (0.5 mg total) by mouth daily as needed for anxiety., Disp: 30 tablet, Rfl: 0   lamoTRIgine (LAMICTAL) 200 MG tablet, Take 1 tablet by mouth every AM, Disp: 90 tablet, Rfl: 1   traZODone (DESYREL) 100 MG tablet, Take 100 mg by mouth at bedtime as needed., Disp: , Rfl:    VRAYLAR 3 MG capsule, TAKE ONE CAPSULE BY MOUTH ONE TIME DAILY (DISCONTINUE 4.5MG  DOSE), Disp: 90 capsule, Rfl: 1   Medications ordered in this encounter:  Meds ordered this encounter  Medications   oseltamivir (TAMIFLU) 75 MG capsule    Sig: Take 1 capsule (75 mg total) by mouth 2 (two) times daily.    Dispense:  10 capsule    Refill:  0    Order Specific Question:   Supervising Provider    Answer:   Hyacinth Meeker, BRIAN [[id number]]     *If you need refills on other medications prior to your next appointment, please contact your pharmacy*  Follow-Up: Call back or seek an in-person evaluation if the symptoms worsen or if the condition fails to improve as anticipated.  Other Instructions Influenza, Adult Influenza, also called "the flu," is a viral infection that mainly affects the respiratory tract. This includes the lungs, nose, and throat. The flu spreads easily from person to person (is contagious). It causes common cold symptoms, along with high fever and body aches. What are the  causes? This condition is caused by the influenza virus. You can get the virus by: Breathing in droplets that are in the air from an infected person's cough or sneeze. Touching something that has the virus on it (has been contaminated) and then touching your mouth, nose, or eyes. What increases the risk? The following factors may make you more likely to get the flu: Not washing or sanitizing your hands often. Having close contact with many people during cold and flu season. Touching your mouth, eyes, or nose without first washing or sanitizing your hands. Not getting an annual flu shot. You may have a higher risk for the flu, including serious problems, such as a lung infection (pneumonia), if you: Are older than 65. Are pregnant. Have a weakened disease-fighting system (immune system). This includes people who have HIV or AIDS, are on chemotherapy, or are taking medicines that reduce (suppress) the immune system. Have a long-term (chronic) illness, such as heart disease, kidney disease, diabetes, or lung disease. Have a liver disorder. Are severely overweight (morbidly obese). Have anemia. Have asthma. What are the signs or symptoms? Symptoms of this condition usually begin suddenly and last 4-14 days. These may include: Fever and chills. Headaches, body aches, or muscle aches. Sore throat. Cough. Runny or stuffy (congested) nose. Chest discomfort. Poor appetite. Weakness or fatigue. Dizziness. Nausea or vomiting. How is this diagnosed? This condition may be diagnosed based on: Your symptoms and medical history. A physical exam. Swabbing  your nose or throat and testing the fluid for the influenza virus. How is this treated? If the flu is diagnosed early, you can be treated with antiviral medicine that is given by mouth (orally) or through an IV. This can help reduce how severe the illness is and how long it lasts. Taking care of yourself at home can help relieve symptoms. Your  health care provider may recommend: Taking over-the-counter medicines. Drinking plenty of fluids. In many cases, the flu goes away on its own. If you have severe symptoms or complications, you may be treated in a hospital. Follow these instructions at home: Activity Rest as needed and get plenty of sleep. Stay home from work or school as told by your health care provider. Unless you are visiting your health care provider, avoid leaving home until your fever has been gone for 24 hours without taking medicine. Eating and drinking Take an oral rehydration solution (ORS). This is a drink that is sold at pharmacies and retail stores. Drink enough fluid to keep your urine pale yellow. Drink clear fluids in small amounts as you are able. Clear fluids include water, ice chips, fruit juice mixed with water, and low-calorie sports drinks. Eat bland, easy-to-digest foods in small amounts as you are able. These foods include bananas, applesauce, rice, lean meats, toast, and crackers. Avoid drinking fluids that contain a lot of sugar or caffeine, such as energy drinks, regular sports drinks, and soda. Avoid alcohol. Avoid spicy or fatty foods. General instructions   Take over-the-counter and prescription medicines only as told by your health care provider. Use a cool mist humidifier to add humidity to the air in your home. This can make it easier to breathe. When using a cool mist humidifier, clean it daily. Empty the water and replace it with clean water. Cover your mouth and nose when you cough or sneeze. Wash your hands with soap and water often and for at least 20 seconds, especially after you cough or sneeze. If soap and water are not available, use alcohol-based hand sanitizer. Keep all follow-up visits. This is important. How is this prevented?  Get an annual flu shot. This is usually available in late summer, fall, or winter. Ask your health care provider when you should get your flu  shot. Avoid contact with people who are sick during cold and flu season. This is generally fall and winter. Contact a health care provider if: You develop new symptoms. You have: Chest pain. Diarrhea. A fever. Your cough gets worse. You produce more mucus. You feel nauseous or you vomit. Get help right away if you: Develop shortness of breath or have difficulty breathing. Have skin or nails that turn a bluish color. Have severe pain or stiffness in your neck. Develop a sudden headache or sudden pain in your face or ear. Cannot eat or drink without vomiting. These symptoms may represent a serious problem that is an emergency. Do not wait to see if the symptoms will go away. Get medical help right away. Call your local emergency services (911 in the [place] Do not drive yourself to the hospital. Summary Influenza, also called "the flu," is a viral infection that primarily affects your respiratory tract. Symptoms of the flu usually begin suddenly and last 4-14 days. Getting an annual flu shot is the best way to prevent getting the flu. Stay home from work or school as told by your health care provider. Unless you are visiting your health care provider, avoid leaving home until your  fever has been gone for 24 hours without taking medicine. Keep all follow-up visits. This is important. This information is not intended to replace advice given to you by your health care provider. Make sure you discuss any questions you have with your health care provider. Document Revised: 07/09/2020 Document Reviewed: 07/09/2020 Elsevier Patient Education  2022 [employer].    If you have been instructed to have an in-person evaluation today at a local Urgent Care facility, please use the link below. It will take you to a list of all of our available [facility], including address, phone number and hours of operation. Please do not delay care.  [facility]  If you or a family  member do not have a primary care provider, use the link below to schedule a visit and establish care. When you choose a [facility] primary care physician or advanced practice provider, you gain a long-term partner in health. Find a Primary Care Provider  Learn more about [facility]'s in-office and virtual care options: [facility] - Get Care Now

## 2021-10-05 NOTE — Progress Notes (Signed)
Virtual Visit Consent   Allison [name], you are scheduled for a virtual visit with a [facility] provider today.     Just as with appointments in the office, your consent must be obtained to participate.  Your consent will be active for this visit and any virtual visit you may have with one of our providers in the next 365 days.     If you have a MyChart account, a copy of this consent can be sent to you electronically.  All virtual visits are billed to your insurance company just like a traditional visit in the office.    As this is a virtual visit, video technology does not allow for your provider to perform a traditional examination.  This may limit your provider's ability to fully assess your condition.  If your provider identifies any concerns that need to be evaluated in person or the need to arrange testing (such as labs, EKG, etc.), we will make arrangements to do so.     Although advances in technology are sophisticated, we cannot ensure that it will always work on either your end or our end.  If the connection with a video visit is poor, the visit may have to be switched to a telephone visit.  With either a video or telephone visit, we are not always able to ensure that we have a secure connection.     I need to obtain your verbal consent now.   Are you willing to proceed with your visit today?    Allison [name] has provided verbal consent on 10/05/2021 for a virtual visit (video or telephone).   Margaretann Loveless, PA-C   Date: 10/05/2021 5:05 PM   Virtual Visit via Video Note   I, Margaretann Loveless, connected with  Allison [name]  ([id number], 1976/07/27) on 10/05/21 at  5:15 PM EDT by a video-enabled telemedicine application and verified that I am speaking with the correct person using two identifiers.  Location: Patient: Virtual Visit Location Patient: Home Provider: Virtual Visit Location Provider: Home Office   I discussed the limitations of evaluation  and management by telemedicine and the availability of in person appointments. The patient expressed understanding and agreed to proceed.    History of Present Illness: Allison [name] is a 45 y.o. who identifies as a female who was assigned female at birth, and is being seen today for possible flu.  HPI: Influenza This is a new problem. The current episode started yesterday. The problem occurs constantly. The problem has been gradually worsening. Associated symptoms include arthralgias, chills, fatigue, a fever (104), myalgias and a sore throat. Pertinent negatives include no congestion or coughing. She has tried acetaminophen, lying down and NSAIDs (cool compresses) for the symptoms. The treatment provided no relief.   Husband has flu  Problems:  Patient Active Problem List   Diagnosis Date Noted   Insomnia 06/03/2020   Gastroesophageal reflux disease 06/03/2020   Hives 06/10/2018   Patellofemoral arthritis 05/13/2018   Lymphedema 12/12/2017   Chronic venous insufficiency 12/12/2017   Morbid obesity (HCC) 09/05/2016   History of Graves' disease 09/05/2016   Bipolar disorder (HCC) 09/05/2016   GAD (generalized anxiety disorder) 11/13/2014    Allergies: No Known Allergies Medications:  Current Outpatient Medications:    oseltamivir (TAMIFLU) 75 MG capsule, Take 1 capsule (75 mg total) by mouth 2 (two) times daily., Disp: 10 capsule, Rfl: 0   clonazePAM (KLONOPIN) 0.5 MG tablet, Take 1 tablet (0.5 mg total) by mouth  daily as needed for anxiety., Disp: 30 tablet, Rfl: 0   lamoTRIgine (LAMICTAL) 200 MG tablet, Take 1 tablet by mouth every AM, Disp: 90 tablet, Rfl: 1   traZODone (DESYREL) 100 MG tablet, Take 100 mg by mouth at bedtime as needed., Disp: , Rfl:    VRAYLAR 3 MG capsule, TAKE ONE CAPSULE BY MOUTH ONE TIME DAILY (DISCONTINUE 4.5MG  DOSE), Disp: 90 capsule, Rfl: 1  Observations/Objective: Patient is well-developed, well-nourished in no acute distress. Appears ill   Resting comfortably at home.  Head is normocephalic, atraumatic.  No labored breathing.  Speech is clear and coherent with logical content.  Patient is alert and oriented at baseline.    Assessment and Plan: 1. Influenza - oseltamivir (TAMIFLU) 75 MG capsule; Take 1 capsule (75 mg total) by mouth 2 (two) times daily.  Dispense: 10 capsule; Refill: 0  - Suspect flu as has positive exposure with symptoms - Mild fever yesterday was first symptom - Tamiflu prescribed - Has high fever today; advised cool compresses, cool bath, tylenol and ibuprofen alternated every 3 hours as needed - Push fluids to avoid dehydration - Rest - Seek in person evaluation if symptoms worsen  Follow Up Instructions: I discussed the assessment and treatment plan with the patient. The patient was provided an opportunity to ask questions and all were answered. The patient agreed with the plan and demonstrated an understanding of the instructions.  A copy of instructions were sent to the patient via MyChart unless otherwise noted below.    The patient was advised to call back or seek an in-person evaluation if the symptoms worsen or if the condition fails to improve as anticipated.  Time:  I spent 10 minutes with the patient via telehealth technology discussing the above problems/concerns.    Margaretann Loveless, PA-C

## 2021-10-07 ENCOUNTER — Telehealth: Payer: Self-pay | Admitting: Family Medicine

## 2021-10-07 NOTE — Telephone Encounter (Signed)
Called Mrs. Fosdick got her in on 11/7 @2 

## 2021-10-10 ENCOUNTER — Encounter: Payer: Self-pay | Admitting: Family Medicine

## 2021-10-10 ENCOUNTER — Other Ambulatory Visit: Payer: Self-pay

## 2021-10-10 ENCOUNTER — Telehealth (INDEPENDENT_AMBULATORY_CARE_PROVIDER_SITE_OTHER): Payer: 59 | Admitting: Family Medicine

## 2021-10-10 DIAGNOSIS — F3170 Bipolar disorder, currently in remission, most recent episode unspecified: Secondary | ICD-10-CM

## 2021-10-10 MED ORDER — LAMOTRIGINE 200 MG PO TABS: 200.0000 mg | ORAL_TABLET | Freq: Every day | ORAL | 3 refills | Status: DC

## 2021-10-10 NOTE — Assessment & Plan Note (Signed)
Stable on lamictal 200 mg daily. Refill provided. Return in 4 months for physical w/ labs

## 2021-10-10 NOTE — Progress Notes (Signed)
    I connected with Allison [name] on 10/10/21 at  2:00 PM EST by video and verified that I am speaking with the correct person using two identifiers.   I discussed the limitations, risks, security and privacy concerns of performing an evaluation and management service by video and the availability of in person appointments. I also discussed with the patient that there may be a patient responsible charge related to this service. The patient expressed understanding and agreed to proceed.  Patient location: Home Provider Location: [facility] Participants: Lynnda Child and Trenell Moxey   Subjective:     Allison [name] is a 45 y.o. female presenting for Medication Refill     HPI  #Bipolar disorder - feels things are going well - pretty self aware - switched to [employer] - but has not been able to find a new therapist  Rarely uses the trazodone 100mg  for sleep and klonopin  Review of Systems   Social History   Tobacco Use  Smoking Status Former   Packs/day: 0.50   Years: 5.00   Pack years: 2.50   Types: Cigarettes   Quit date: 12/05/2007   Years since quitting: 13.8  Smokeless Tobacco Never        Objective:   BP Readings from Last 3 Encounters:  10/07/20 120/80  06/03/20 102/68  06/18/19 110/62   Wt Readings from Last 3 Encounters:  10/07/20 195 lb (88.5 kg)  06/03/20 206 lb (93.4 kg)  12/11/19 217 lb (98.4 kg)   There were no vitals taken for this visit.  Physical Exam Constitutional:      Appearance: Normal appearance. She is not ill-appearing.  HENT:     Head: Normocephalic and atraumatic.     Right Ear: External ear normal.     Left Ear: External ear normal.  Eyes:     Conjunctiva/sclera: Conjunctivae normal.  Pulmonary:     Effort: Pulmonary effort is normal. No respiratory distress.  Neurological:     Mental Status: She is alert. Mental status is at baseline.  Psychiatric:        Mood and Affect: Mood  normal.        Behavior: Behavior normal.        Thought Content: Thought content normal.        Judgment: Judgment normal.          Assessment & Plan:   Problem List Items Addressed This Visit       Other   Bipolar disorder (HCC) - Primary    Stable on lamictal 200 mg daily. Refill provided. Return in 4 months for physical w/ labs      Relevant Medications   lamoTRIgine (LAMICTAL) 200 MG tablet     Return if symptoms worsen or fail to improve.  02/08/20, MD

## 2021-10-24 ENCOUNTER — Ambulatory Visit (INDEPENDENT_AMBULATORY_CARE_PROVIDER_SITE_OTHER): Payer: 59

## 2021-10-24 ENCOUNTER — Other Ambulatory Visit: Payer: Self-pay

## 2021-10-24 DIAGNOSIS — Z23 Encounter for immunization: Secondary | ICD-10-CM | POA: Diagnosis not present

## 2021-12-13 LAB — TSH: TSH: 1.87 (ref 0.41–5.90)

## 2021-12-13 LAB — CBC AND DIFFERENTIAL
HCT: 41 (ref 36–46)
Hemoglobin: 14 (ref 12.0–16.0)
Neutrophils Absolute: 49
Platelets: 296 (ref 150–399)
WBC: 8.5

## 2021-12-13 LAB — LIPID PANEL
Cholesterol: 192 (ref 0–200)
HDL: 42 (ref 35–70)
LDL Cholesterol: 126
LDl/HDL Ratio: 3
Triglycerides: 133 (ref 40–160)

## 2021-12-13 LAB — HM MAMMOGRAPHY

## 2021-12-13 LAB — CBC: RBC: 4.55 (ref 3.87–5.11)

## 2022-01-16 ENCOUNTER — Ambulatory Visit (INDEPENDENT_AMBULATORY_CARE_PROVIDER_SITE_OTHER): Payer: 59 | Admitting: Family Medicine

## 2022-01-16 ENCOUNTER — Other Ambulatory Visit: Payer: Self-pay

## 2022-01-16 VITALS — BP 90/60 | HR 84 | Temp 98.1°F | Ht 64.5 in | Wt 204.6 lb

## 2022-01-16 DIAGNOSIS — F3170 Bipolar disorder, currently in remission, most recent episode unspecified: Secondary | ICD-10-CM | POA: Diagnosis not present

## 2022-01-16 DIAGNOSIS — R110 Nausea: Secondary | ICD-10-CM

## 2022-01-16 DIAGNOSIS — F4322 Adjustment disorder with anxiety: Secondary | ICD-10-CM

## 2022-01-16 DIAGNOSIS — Z0000 Encounter for general adult medical examination without abnormal findings: Secondary | ICD-10-CM | POA: Diagnosis not present

## 2022-01-16 MED ORDER — CARIPRAZINE HCL 3 MG PO CAPS: ORAL_CAPSULE | ORAL | 3 refills | Status: DC

## 2022-01-16 NOTE — Progress Notes (Signed)
Annual Exam   Chief Complaint:  Chief Complaint  Patient presents with   Annual Exam    No concerns. Mammo and PAP done at [facility] clinic on 12/13/21. Will request records.     History of Present Illness:  [name] is a 46 y.o. No obstetric history on file. who LMP was No LMP recorded. Patient has had an ablation., presents today for her annual examination.     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   Social History   Tobacco Use  Smoking Status Former   Packs/day: 0.50   Years: 5.00   Pack years: 2.50   Types: Cigarettes   Quit date: 12/05/2007   Years since quitting: 14.1  Smokeless Tobacco Never   Social History   Substance and Sexual Activity  Alcohol Use Yes   Comment: occ, 1-2 a week   Social History   Substance and Sexual Activity  Drug Use No    Safety The patient wears seatbelts: yes.     The patient feels safe at home and in their relationships: yes.  General Health Dentist in the last year: No Eye doctor: yes  Menstrual LMP 2016 s/p ablation No menopause symptoms  GYN She is single partner, contraception - ablation.    Cervical Cancer Screening:   Last Pap:   January 2023 Results were: no abnormalities /neg HPV DNA    Breast Cancer Screening There is FH of breast cancer. There is no FH of ovarian cancer. BRCA screening Not Indicated.  Discussed that for average risk women between age 80-49 screening may reduce the risk of breast cancer death, however, at a lower rate than those over age 58. And that the the false-positive rates resulting in unnecessary biopsies with more screening is higher. The balance of benefits vs harms likely improves as you progress through your 40s. The patient does want a mammogram this year.   Colon Cancer Screening:  Age 45-75 yo - benefits outweigh the risk. Adults 57-85 yo who have  never been screened benefit.  Benefits: 134000 people in 2016 will be diagnosed and 49,000 will die - early detection helps Harms: Complications 2/2 to colonoscopy High Risk (Colonoscopy): genetic disorder (Lynch syndrome or familial adenomatous polyposis), personal hx of IBD, previous adenomatous polyp, or previous colorectal cancer, FamHx start 10 years before the age at diagnosis, increased in males and black race  Options:  FIT - looks for hemoglobin (blood in the stool) - specific and fairly sensitive - must be done annually Cologuard - looks for DNA and blood - more sensitive - therefore can have more false positives, every 3 years Colonoscopy - every 10 years if normal - sedation, bowl prep, must have someone drive you  Shared decision making and the patient had decided to do colonoscopy - will plan to get next year.  Weight Wt Readings from Last 3 Encounters:  01/16/22 204 lb 9 oz (92.8 kg)  10/07/20 195 lb (88.5 kg)  06/03/20 206 lb (93.4 kg)   Patient has high BMI  BMI Readings from Last 1 Encounters:  01/16/22 34.57 kg/m     Chronic disease screening Blood pressure monitoring:  BP Readings from Last 3 Encounters:  01/16/22 90/60  10/07/20 120/80  06/03/20 102/68    Lipid Monitoring: Indication for screening: age >54, obesity, diabetes, family hx, CV risk factors.  Lipid screening: Yes  Lab Results  Component Value Date   CHOL 192 12/13/2021   HDL 42 12/13/2021   LDLCALC 126 12/13/2021   TRIG 133 12/13/2021   CHOLHDL 5 01/02/2018     Diabetes Screening: age >72, overweight, family hx, PCOS, hx of gestational diabetes, at risk ethnicity Diabetes Screening screening: Yes  Lab Results  Component Value Date   HGBA1C 5.4 05/22/2016     Past Medical History:  Diagnosis Date   Anxiety    Bipolar disorder, in full remission, most recent episode hypomanic ([place])    Depression    Insomnia    Neuropathy    Thyroid disease     Past Surgical History:   Procedure Laterality Date   ABLATION     APPENDECTOMY     TUBAL LIGATION      Prior to Admission medications   Medication Sig Start Date End Date Taking? Authorizing Provider  clonazePAM (KLONOPIN) 0.5 MG tablet Take 1 tablet (0.5 mg total) by mouth daily as needed for anxiety. 09/05/16  Yes Bedsole, Amy E, MD  lamoTRIgine (LAMICTAL) 200 MG tablet Take 1 tablet (200 mg total) by mouth daily. TAKE ONE TABLET BY MOUTH EVERY MORNING 10/10/21  Yes Lesleigh Noe, MD  VRAYLAR 3 MG capsule TAKE ONE CAPSULE BY MOUTH ONE TIME DAILY (DISCONTINUE 4.5MG DOSE) 08/01/21  Yes Lesleigh Noe, MD    No Known Allergies  Gynecologic History: No LMP recorded. Patient has had an ablation.  Obstetric History: No obstetric history on file.  Social History   Socioeconomic History   Marital status: Married    Spouse name: Allison [name]   Number of children: 3   Years of education: Bachelors   Highest education level: Not on file  Occupational History   Not on file  Tobacco Use   Smoking status: Former    Packs/day: 0.50    Years: 5.00    Pack years: 2.50    Types: Cigarettes    Quit date: 12/05/2007    Years since quitting: 14.1   Smokeless tobacco: Never  Vaping Use   Vaping Use: Never used  Substance and Sexual Activity   Alcohol use: Yes    Comment: occ, 1-2 a week   Drug use: No   Sexual activity: Yes    Birth control/protection: Surgical    Comment: single partner  Other Topics Concern   Not on file  Social History Narrative   06/03/20   From: [name]   Living: with Allison [name], Husband (2010) and 3 kids   Work: stay at home mom       Family: 3 children - Allison [name] (2010), Allison [name] (2013), Allison [name] (2014)      Enjoys: spend time with kids, read, Psychologist, occupational at [employer]      Exercise: not currently   Diet: currently poor appetite      Safety   Seat belts: Yes    Guns: No   Safe in relationships: Yes    Social Determinants of Radio broadcast assistant Strain: Not on file  Food  Insecurity: Not on file  Transportation Needs: Not on file  Physical Activity: Not on file  Stress: Not on file  Social Connections: Not on file  Intimate Partner Violence: Not on file    Family History  Problem Relation Age of Onset   Thyroid disease Mother        thyroid removed at age 31   Depression Mother    Arthritis Father    Hypertension Father  Breast cancer Maternal Grandmother 28   Depression Maternal Aunt    Heart attack Paternal Grandfather        pipe smoker   Neuropathy Neg Hx    Migraines Neg Hx     Review of Systems  Constitutional:  Negative for chills and fever.  HENT:  Negative for congestion and sore throat.   Eyes:  Negative for blurred vision and double vision.  Respiratory:  Negative for shortness of breath.   Cardiovascular:  Negative for chest pain.  Gastrointestinal:  Negative for heartburn, nausea and vomiting.  Genitourinary: Negative.   Musculoskeletal: Negative.  Negative for myalgias.  Skin:  Negative for rash.  Neurological:  Negative for dizziness and headaches.  Endo/Heme/Allergies:  Does not bruise/bleed easily.  Psychiatric/Behavioral:  Negative for depression. The patient is not nervous/anxious.     Physical Exam BP 90/60    Pulse 84    Temp 98.1 F (36.7 C) (Oral)    Ht 5' 4.5" (1.638 m)    Wt 204 lb 9 oz (92.8 kg)    SpO2 100%    BMI 34.57 kg/m    BP Readings from Last 3 Encounters:  01/16/22 90/60  10/07/20 120/80  06/03/20 102/68      Physical Exam Constitutional:      General: She is not in acute distress.    Appearance: She is well-developed. She is not diaphoretic.  HENT:     Head: Normocephalic and atraumatic.     Right Ear: External ear normal.     Left Ear: External ear normal.     Nose: Nose normal.  Eyes:     General: No scleral icterus.    Extraocular Movements: Extraocular movements intact.     Conjunctiva/sclera: Conjunctivae normal.  Cardiovascular:     Rate and Rhythm: Normal rate and regular  rhythm.     Heart sounds: No murmur heard. Pulmonary:     Effort: Pulmonary effort is normal. No respiratory distress.     Breath sounds: Normal breath sounds. No wheezing.  Abdominal:     General: Bowel sounds are normal. There is no distension.     Palpations: Abdomen is soft. There is no mass.     Tenderness: There is no abdominal tenderness. There is no guarding or rebound.  Musculoskeletal:        General: Normal range of motion.     Cervical back: Neck supple.  Lymphadenopathy:     Cervical: No cervical adenopathy.  Skin:    General: Skin is warm and dry.     Capillary Refill: Capillary refill takes less than 2 seconds.  Neurological:     Mental Status: She is alert and oriented to person, place, and time.     Deep Tendon Reflexes: Reflexes normal.  Psychiatric:        Mood and Affect: Mood normal.        Behavior: Behavior normal.     Results:  PHQ-9:  [place] Office Visit from 05/11/2017 in Primary Care at [facility]  PHQ-9 Total Score 21         Assessment: 46 y.o. No obstetric history on file. female here for routine annual physical examination.  Plan: Problem List Items Addressed This Visit       Other   Bipolar disorder ([place])    Stable. Continue medications      Relevant Medications   cariprazine (VRAYLAR) 3 MG capsule   Other Visit Diagnoses     Annual physical exam    -  Primary   Adjustment disorder with anxious mood       Nausea without vomiting           Screening: -- Blood pressure screen normal -- cholesterol screening:  reviewed outside labs, slightly elevated, continue diet -- Weight screening: overweight: continue to monitor -- Diabetes Screening:  reviewed normal outside labs -- Nutrition: Encouraged healthy diet  The 10-year ASCVD risk score (Arnett DK, et al., 2019) is: 0.6%*   Values used to calculate the score:     Age: 65 years     Sex: Female     Is Non-Hispanic African American: No     Diabetic: No     Tobacco  smoker: No     Systolic Blood Pressure: 90 mmHg     Is BP treated: No     HDL Cholesterol: 42 mg/dL*     Total Cholesterol: 192 mg/dL*     * - Cholesterol units were assumed for this score calculation  -- Statin therapy for Age 38-75 with CVD risk >7.5%  Psych -- Depression screening (PHQ-9):    Depression screen [facility] 2/9 01/16/2022 01/20/2019 05/11/2017  Decreased Interest 0 0 3  Down, Depressed, Hopeless 0 0 3  PHQ - 2 Score 0 0 6  Altered sleeping - - 3  Tired, decreased energy - - 3  Change in appetite - - 3  Feeling bad or failure about yourself  - - 3  Trouble concentrating - - 3  Moving slowly or fidgety/restless - - 0  Suicidal thoughts - - 0  PHQ-9 Score - - 21  Difficult doing work/chores - - Very difficult     Safety -- tobacco screening: not using -- alcohol screening:  low-risk usage. -- no evidence of domestic violence or intimate partner violence.   Cancer Screening -- pap smear not collected per ASCCP guidelines -- family history of breast cancer screening: done. not at high risk. -- Mammogram -  requested -- Colon cancer (age 85+)--  due next year  Immunizations Immunization History  Administered Date(s) Administered   Influenza Split 10/08/2013   Influenza, Seasonal, Injecte, Preservative Fre 10/04/2007   Influenza,inj,Quad PF,6+ Mos 09/09/2014, 08/24/2015, 08/11/2016, 11/07/2017, 10/10/2018, 10/07/2020, 10/24/2021   PFIZER Comirnaty(Gray Top)Covid-19 Tri-Sucrose Vaccine 02/11/2020, 03/03/2020, 12/10/2020   Tdap 01/29/2008, 03/05/2013    -- flu vaccine up to date -- TDAP q10 years up to date  -- Covid-19 Vaccine not up to date - due for booster   Encouraged healthy diet and exercise. Encouraged regular vision and dental care.   Lesleigh Noe, MD

## 2022-01-16 NOTE — Assessment & Plan Note (Signed)
Stable. Continue medications

## 2022-04-06 ENCOUNTER — Encounter: Payer: Self-pay | Admitting: Family Medicine

## 2022-04-20 ENCOUNTER — Encounter: Payer: Self-pay | Admitting: Family Medicine

## 2022-09-08 ENCOUNTER — Telehealth: Payer: Self-pay

## 2022-09-08 NOTE — Telephone Encounter (Signed)
Dahlia Client RN with access nurse said that pt was having upper rt calf pain pain scale 3, pt can't straighten leg completely at knee, pt has swelling lower rt leg. No redness or warmth noted. No available appt at [facility] or [facility]. Pt is concerned DVT. Dahlia Client advised pt need be seen next 4 hrs and Dahlia Client will advise pt to go to ED for eval and possible testing. Sending note to Worthy Rancher FNP.pt does not have TOC. Per access note pt is going to [facility] ED.   [facility] - Client TELEPHONE ADVICE RECORD AccessNurse Patient Name: Allison [name] Gender: Female DOB: 1976-04-09 Age: 46 Y 9 M 16 D Return Phone Number: [phone] (Primary) Address: [address] City/ State/ Zip: [place]  [address] Client [facility] - Client Client Site  [facility] - Day Provider Gweneth Dimitri- MD Contact Type Call Who Is Calling Patient / Member / Family / Caregiver Call Type Triage / Clinical Relationship To Patient Self Return Phone Number [phone] (Primary) Chief Complaint Leg Pain Reason for Call Symptomatic / Request for Health Information Initial Comment Caller states she is having some pain in her lower right leg. Right behind the kneww slightly swollen and can not strighten it. Caller states its not a leg crap she thinks it may be a blood clot. Translation No Nurse Assessment Nurse: [name], RN, Dahlia Client Date/Time (Eastern Time): 09/08/2022 3:47:07 PM Confirm and document reason for call. If symptomatic, describe symptoms. ---Caller reports having right leg pain, reports swelling behind her knee, unable to completely straighten her leg. Not warm to touch. Does the patient have any new or worsening symptoms? ---Yes Will a triage be completed? ---Yes Related visit to physician within the last 2 weeks? ---No Does the PT have any chronic conditions? (i.e. diabetes, asthma, this includes High risk factors  for pregnancy, etc.) ---No Is the patient pregnant or possibly pregnant? (Ask all females between the ages of 51-55) ---No Is this a behavioral health or substance abuse call? ---No Guidelines Guideline Title Affirmed Question Affirmed Notes Nurse Date/Time Lamount Cohen Time) Leg Pain [1] Thigh or calf pain AND [2] only 1 side AND [3] present > 1 hour (Exception: Chronic unchanged pain.) Debby Freiberg 09/08/2022 3:48:34 PM PLEASE NOTE: All timestamps contained within this report are represented as [place] Standard Time. CONFIDENTIALTY NOTICE: This fax transmission is intended only for the addressee. It contains information that is legally privileged, confidential or otherwise protected from use or disclosure. If you are not the intended recipient, you are strictly prohibited from reviewing, disclosing, copying using or disseminating any of this information or taking any action in reliance on or regarding this information. If you have received this fax in error, please notify us immediately by telephone so that we can arrange for its return to [place]. Phone: [phone], Toll-Free: [phone], Fax: [phone] Page: 2 of 2 Call Id: [id number] Disp. Time Lamount Cohen Time) Disposition Final User 09/08/2022 3:53:32 PM See HCP within 4 Hours (or PCP triage) Yes [name], RN, Dahlia Client Final Disposition 09/08/2022 3:53:32 PM See HCP within 4 Hours (or PCP triage) Yes [name], RN, Loreli Slot Disagree/Comply Comply Caller Understands Yes PreDisposition Call Doctor Care Advice Given Per Guideline SEE HCP (OR PCP TRIAGE) WITHIN 4 HOURS: * IF OFFICE WILL BE OPEN: You need to be seen within the next 3 or 4 hours. Call your doctor (or NP/PA) now or as soon as the office opens. * OFFICE: If patient  sounds stable and not seriously ill, consult PCP (or follow your office policy) to see if patient can be seen NOW in office. CALL BACK IF: * You become worse CARE ADVICE given per Leg Pain (Adult)  guideline. Referrals [place]

## 2022-09-11 NOTE — Telephone Encounter (Signed)
Do not see per chart review tab where pt was seen at ED. Left v/m requesting pt to call [place].sending note to [place] triage and [facility] CMA.

## 2022-09-11 NOTE — Telephone Encounter (Signed)
I spoke with pt and pt did not be seen over weekend; pt said shortly after she spoke with access nurse the pain in leg went away; pt said it seemed like a leg cramp. Today pt said it is not hurting right now and if pain keeps coming and going pt will cb for appt and UC &ED precautions given and pt voiced understanding. Pt said her husband is out of town and pt has 3 children. Sending note to Dutch Quint FNP.

## 2022-09-13 ENCOUNTER — Telehealth: Payer: Self-pay | Admitting: Family Medicine

## 2022-09-13 ENCOUNTER — Telehealth: Payer: Self-pay

## 2022-09-13 NOTE — Telephone Encounter (Signed)
Prior auth started and approved for Vraylar 3MG  capsules. Allison [name] (Key: [id number]) Rx #: [id number] We're pleased to let you know that we've approved your or your doctor's request for coverage for Vraylar 3MG  OR CAPS. You can now fill your prescription, and it will be covered according to your plan. As long as you remain covered by your prescription drug plan and there are no changes to your plan benefits, this request is approved from 09/13/2022 to 09/13/2023.   Patient notified by phone call.  Approval letter sent to scanning.

## 2022-09-13 NOTE — Telephone Encounter (Signed)
Spoke to pt and notified her that she has refills at [facility] for this medication. Pt states that she has new insurance and they are not wanting to approve it. Told pt we would need a copy of her new insurance card so that when we get a PA for it we can run it through. Pt will send via mychart.  Pt's TDAP is up to date until 2024.  Scheduled pt for a TOC appt with Matt on 10/04/22. Told pt she can get her TB test done on that day and will have to have it read on that following Friday.

## 2022-09-13 NOTE — Telephone Encounter (Signed)
Pt requesting refill of Vraylar 3mg . Is completely out. Knows she waited too long. Not sure who, if anyone can refill.   Also pt needing tb test and tdap, can she just come in or does it have to be ordered?

## 2022-09-27 ENCOUNTER — Encounter: Payer: Self-pay | Admitting: Nurse Practitioner

## 2022-09-27 ENCOUNTER — Ambulatory Visit: Payer: 59 | Admitting: Nurse Practitioner

## 2022-09-27 VITALS — BP 124/82 | HR 99 | Temp 98.2°F | Ht 64.5 in | Wt 196.0 lb

## 2022-09-27 DIAGNOSIS — R051 Acute cough: Secondary | ICD-10-CM | POA: Diagnosis not present

## 2022-09-27 DIAGNOSIS — Z20828 Contact with and (suspected) exposure to other viral communicable diseases: Secondary | ICD-10-CM | POA: Insufficient documentation

## 2022-09-27 DIAGNOSIS — G4489 Other headache syndrome: Secondary | ICD-10-CM | POA: Diagnosis not present

## 2022-09-27 DIAGNOSIS — J029 Acute pharyngitis, unspecified: Secondary | ICD-10-CM | POA: Insufficient documentation

## 2022-09-27 DIAGNOSIS — M791 Myalgia, unspecified site: Secondary | ICD-10-CM

## 2022-09-27 LAB — POCT RAPID STREP A (OFFICE): Rapid Strep A Screen: NEGATIVE

## 2022-09-27 NOTE — Patient Instructions (Addendum)
Nice to see you today I will be in touch with the RSV combo swab Stay out of work until then Follow up if no improvement  Strep test was negative in office today   Rest, drink plenty of fluid. Over the counter medications for symptom relief

## 2022-09-27 NOTE — Assessment & Plan Note (Signed)
Pending COVID, RSV, flu swab.  Over-the-counter medications for symptomatic relief as needed.

## 2022-09-27 NOTE — Assessment & Plan Note (Signed)
Pending RSV swab.  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

## 2022-09-27 NOTE — Assessment & Plan Note (Signed)
Rest, plenty of fluids, pending RSV, flu, COVID swab.  Continue using over-the-counter analgesics as needed

## 2022-09-27 NOTE — Assessment & Plan Note (Signed)
Strep test negative in office.  Pending flu, COVID, RSV.  Over-the-counter analgesics as needed for discomfort

## 2022-09-27 NOTE — Progress Notes (Signed)
Acute Office Visit  Subjective:     Patient ID: Allison [name], female    DOB: 01-10-1976, 46 y.o.   MRN: [id number]  Chief Complaint  Patient presents with   Acute Visit    Patient has had cough, fatigue, runny nose and her lungs hurt to breathe. Patient states this started Monday evening. She took a covid test Monday night and again this morning and both were negative. 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 The only thing she has taken was ibuprofen.     HPI Patient is in today for Cough  Symptoms started on Monday evening. She took a covid test atht night and then then wednesday morning . 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 Negative for fever.       Appetite decreased Fluid intake is ok   HENT:  Positive for sinus pain and sore throat. Negative for ear discharge and ear pain.   Respiratory:  Positive for cough and shortness of breath. Negative for sputum production.   Musculoskeletal:  Positive for myalgias.  Neurological:  Positive for headaches.        Objective:    BP 124/82 (BP Location: Left Arm, Patient Position: Sitting, Cuff Size: Normal)   Pulse 99   Temp 98.2 F (36.8 C) (Temporal)   Ht 5' 4.5" (1.638 m)   Wt 196 lb (88.9 kg)   SpO2 99%   BMI 33.12 kg/m    Physical Exam Vitals and nursing note reviewed.  Constitutional:      Appearance: Normal appearance.  HENT:     Right Ear: Tympanic membrane, ear canal and external ear normal.     Left Ear: Tympanic membrane, ear canal and external ear normal.     Nose:     Right Sinus: No maxillary sinus tenderness or frontal sinus tenderness.     Left Sinus: No maxillary sinus tenderness or frontal sinus tenderness.     Mouth/Throat:     Mouth: Mucous membranes are moist.   Cardiovascular:     Rate and Rhythm: Normal rate and regular rhythm.     Heart sounds: Normal heart sounds.  Pulmonary:     Effort: Pulmonary effort is normal.     Breath sounds: Normal breath sounds.  Lymphadenopathy:     Cervical: Cervical adenopathy present.  Neurological:     Mental Status: She is alert.     Results for orders placed or performed in visit on 09/27/22  Rapid Strep A  Result Value Ref Range   Rapid Strep A Screen Negative Negative        Assessment & Plan:   Problem List Items Addressed This Visit       Other   Myalgia    Rest, plenty of fluids, pending RSV, flu, COVID swab.  Continue using over-the-counter analgesics as needed      Relevant Orders   COVID-19, Flu A+B and RSV   Acute cough    Pending COVID, RSV, flu swab.  Over-the-counter medications for symptomatic relief as needed.      Relevant Orders   COVID-19, Flu A+B and RSV   Other headache syndrome    Rest, plenty of fluids, over-the-counter analgesics as needed.  Pending COVID, flu, RSV swab  Relevant Orders   COVID-19, Flu A+B and RSV   RSV exposure    Pending RSV swab.  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      Relevant Orders   COVID-19, Flu A+B and RSV   Sore throat - Primary    Strep test negative in office.  Pending flu, COVID, RSV.  Over-the-counter analgesics as needed for discomfort      Relevant Orders   Rapid Strep A (Completed)    No orders of the defined types were placed in this encounter.   Return if symptoms worsen or fail to improve.  Romilda Garret, NP

## 2022-09-27 NOTE — Assessment & Plan Note (Signed)
Rest, plenty of fluids, over-the-counter analgesics as needed.  Pending COVID, flu, RSV swab

## 2022-09-29 ENCOUNTER — Telehealth: Payer: Self-pay | Admitting: Family Medicine

## 2022-09-29 LAB — COVID-19, FLU A+B AND RSV
Influenza A, NAA: NOT DETECTED
Influenza B, NAA: NOT DETECTED
RSV, NAA: DETECTED — AB
SARS-CoV-2, NAA: NOT DETECTED

## 2022-09-29 NOTE — Telephone Encounter (Signed)
Patient was seen 09/27/22 with Allison [name],and she called in today stating that her cough is really worse today, and she would like advice on whether she should just ride it out for the weekend or is there anything else that can be done? Please advise.

## 2022-09-29 NOTE — Telephone Encounter (Signed)
Called and discussed with patient. She will try over the counter like delsym. If she is having a hard time she will reach out to the office on Monday. Offered to send in cough syrup

## 2022-10-04 ENCOUNTER — Encounter: Payer: 59 | Admitting: Nurse Practitioner

## 2022-11-21 ENCOUNTER — Ambulatory Visit: Payer: 59 | Admitting: Nurse Practitioner

## 2022-11-21 ENCOUNTER — Encounter: Payer: Self-pay | Admitting: Nurse Practitioner

## 2022-11-21 VITALS — BP 114/74 | HR 75 | Temp 98.2°F | Resp 16 | Ht 64.5 in | Wt 203.4 lb

## 2022-11-21 DIAGNOSIS — Z8639 Personal history of other endocrine, nutritional and metabolic disease: Secondary | ICD-10-CM | POA: Diagnosis not present

## 2022-11-21 DIAGNOSIS — F411 Generalized anxiety disorder: Secondary | ICD-10-CM | POA: Diagnosis not present

## 2022-11-21 DIAGNOSIS — F3170 Bipolar disorder, currently in remission, most recent episode unspecified: Secondary | ICD-10-CM

## 2022-11-21 DIAGNOSIS — E669 Obesity, unspecified: Secondary | ICD-10-CM | POA: Diagnosis not present

## 2022-11-21 DIAGNOSIS — Z1211 Encounter for screening for malignant neoplasm of colon: Secondary | ICD-10-CM

## 2022-11-21 LAB — CBC
HCT: 43.5 % (ref 36.0–46.0)
Hemoglobin: 14.7 g/dL (ref 12.0–15.0)
MCHC: 33.7 g/dL (ref 30.0–36.0)
MCV: 91.2 fl (ref 78.0–100.0)
Platelets: 345 10*3/uL (ref 150.0–400.0)
RBC: 4.77 Mil/uL (ref 3.87–5.11)
RDW: 12 % (ref 11.5–15.5)
WBC: 8.8 10*3/uL (ref 4.0–10.5)

## 2022-11-21 LAB — LIPID PANEL
Cholesterol: 178 mg/dL (ref 0–200)
HDL: 41.7 mg/dL (ref >39.00)
NonHDL: 136.1
Total CHOL/HDL Ratio: 4
Triglycerides: 239 mg/dL — ABNORMAL HIGH (ref 0.0–149.0)
VLDL: 47.8 mg/dL — ABNORMAL HIGH (ref 0.0–40.0)

## 2022-11-21 LAB — COMPREHENSIVE METABOLIC PANEL
ALT: 13 U/L (ref 0–35)
AST: 13 U/L (ref 0–37)
Albumin: 4.2 g/dL (ref 3.5–5.2)
Alkaline Phosphatase: 89 U/L (ref 39–117)
BUN: 16 mg/dL (ref 6–23)
CO2: 30 mEq/L (ref 19–32)
Calcium: 9.8 mg/dL (ref 8.4–10.5)
Chloride: 103 mEq/L (ref 96–112)
Creatinine, Ser: 0.84 mg/dL (ref 0.40–1.20)
GFR: 84.17 mL/min (ref >60.00)
Glucose, Bld: 81 mg/dL (ref 70–99)
Potassium: 4.4 mEq/L (ref 3.5–5.1)
Sodium: 139 mEq/L (ref 135–145)
Total Bilirubin: 0.3 mg/dL (ref 0.2–1.2)
Total Protein: 7.1 g/dL (ref 6.0–8.3)

## 2022-11-21 LAB — HEMOGLOBIN A1C: Hgb A1c MFr Bld: 5.4 % (ref 4.6–6.5)

## 2022-11-21 LAB — LDL CHOLESTEROL, DIRECT: Direct LDL: 109 mg/dL

## 2022-11-21 LAB — TSH: TSH: 0.72 u[IU]/mL (ref 0.35–5.50)

## 2022-11-21 NOTE — Assessment & Plan Note (Signed)
Patient currently mains on Klonopin 0.5 as needed.  Patient states she uses it very infrequently

## 2022-11-21 NOTE — Patient Instructions (Addendum)
Nice to see you today I will be in touch with the labs once I have the results Follow up with me in 6 months for your physical, sooner if you need me  Don't forget to get your flu vaccine

## 2022-11-21 NOTE — Progress Notes (Signed)
Established Patient Office Visit  Subjective   Patient ID: Allison [name], female    DOB: 1976-02-24  Age: 46 y.o. MRN: [id number]  Chief Complaint  Patient presents with   Transitions Of Care    From Dr Selena Batten    HPI  Transfer of care: Last CPE was on 01/16/2022 with Dr Selena Batten. Last office visit was wit me on 09/27/2022    Bipolar: States on vraylar and lamitcal. Not currently managed by pyshc. States that she did do therapy in the past and stopped due to insurance.  Patient denies HI/SI/AVH.  GAD: Very infrequent. Has been a couple months since last use of klopinin   Tdap:2014 Flu: defer, until she brings her children for flu vaccines Covid: pfizer x2 and one booster HPV: aged out  Pap Smear: GYn goes back 12/13/2021 last one. [facility] Mammogram: GYN  Colonscopy: cologuard Eye: Glasses. Yearly  Dentist: needs updating      Review of Systems  Constitutional:  Negative for chills and fever.  Respiratory:  Negative for shortness of breath.   Cardiovascular:  Negative for chest pain.  Gastrointestinal:  Negative for abdominal pain, constipation, diarrhea, nausea and vomiting.       Bm daily   Genitourinary:  Negative for dysuria and hematuria.  Neurological:  Negative for tingling and headaches.  Psychiatric/Behavioral:  Negative for hallucinations and suicidal ideas. The patient does not have insomnia.       Objective:     BP 114/74   Pulse 75   Temp 98.2 F (36.8 C)   Resp 16   Ht 5' 4.5" (1.638 m)   Wt 203 lb 6 oz (92.3 kg)   SpO2 98%   BMI 34.37 kg/m    Physical Exam Vitals and nursing note reviewed.  Constitutional:      Appearance: Normal appearance.  HENT:     Right Ear: Tympanic membrane, ear canal and external ear normal.     Left Ear: Tympanic membrane, ear canal and external ear normal.     Mouth/Throat:     Mouth: Mucous membranes are moist.     Pharynx: Oropharynx is clear.  Eyes:     Extraocular Movements:  Extraocular movements intact.     Pupils: Pupils are equal, round, and reactive to light.     Comments: Intermittent glasses wearing   Cardiovascular:     Rate and Rhythm: Normal rate and regular rhythm.     Heart sounds: Normal heart sounds.  Pulmonary:     Effort: Pulmonary effort is normal.     Breath sounds: Normal breath sounds.  Musculoskeletal:     Right lower leg: No edema.     Left lower leg: No edema.  Lymphadenopathy:     Cervical: No cervical adenopathy.  Skin:    General: Skin is warm.  Neurological:     General: No focal deficit present.     Mental Status: She is alert.     Deep Tendon Reflexes:     Reflex Scores:      Bicep reflexes are 2+ on the right side and 2+ on the left side.      Patellar reflexes are 2+ on the right side and 2+ on the left side.    Comments: Bilateral upper and lower extremity strength 5/5  Psychiatric:        Mood and Affect: Mood normal.        Behavior: Behavior normal.        Thought Content:  Thought content normal.        Judgment: Judgment normal.      No results found for any visits on 11/21/22.    The 10-year ASCVD risk score (Arnett DK, et al., 2019) is: 0.9%* (Cholesterol units were assumed)    Assessment & Plan:   Problem List Items Addressed This Visit       Other   GAD (generalized anxiety disorder) - Primary    Patient currently mains on Klonopin 0.5 as needed.  Patient states she uses it very infrequently      Relevant Orders   TSH   History of Graves' disease    Pending TSH today.      Relevant Orders   TSH   Bipolar disorder (HCC)    Patient currently stable on Vraylar and Lamictal.  Patient not currently seeing therapy for psychiatry.  She is trying get back in with her own therapist as her insurance switched back.  Patient does not have any hallucinations.  States when illness was not controlled she would have ideations but not tactile hallucinations.  No HI/SI      Relevant Orders   CBC    Comprehensive metabolic panel   Hemoglobin A1c   TSH   Lipid panel   Obesity (BMI 30-39.9)    Will check labs inclusive of A1c since patient is on antipsychotic medication.      Relevant Orders   Hemoglobin A1c   TSH   Lipid panel   Other Visit Diagnoses     Screening for colon cancer       Relevant Orders   Cologuard       Return in about 6 months (around 05/23/2023) for CPE and labs.    Audria Nine, NP

## 2022-11-21 NOTE — Assessment & Plan Note (Signed)
Pending TSH today. 

## 2022-11-21 NOTE — Assessment & Plan Note (Signed)
Will check labs inclusive of A1c since patient is on antipsychotic medication.

## 2022-11-21 NOTE — Assessment & Plan Note (Signed)
Patient currently stable on Vraylar and Lamictal.  Patient not currently seeing therapy for psychiatry.  She is trying get back in with her own therapist as her insurance switched back.  Patient does not have any hallucinations.  States when illness was not controlled she would have ideations but not tactile hallucinations.  No HI/SI

## 2022-12-18 LAB — COLOGUARD: COLOGUARD: NEGATIVE

## 2022-12-27 ENCOUNTER — Telehealth: Payer: Self-pay | Admitting: Nurse Practitioner

## 2022-12-27 ENCOUNTER — Other Ambulatory Visit: Payer: Self-pay

## 2022-12-27 DIAGNOSIS — F3170 Bipolar disorder, currently in remission, most recent episode unspecified: Secondary | ICD-10-CM

## 2022-12-27 MED ORDER — LAMOTRIGINE 200 MG PO TABS: 200.0000 mg | ORAL_TABLET | Freq: Every day | ORAL | 3 refills | Status: DC

## 2022-12-27 NOTE — Telephone Encounter (Signed)
Refill sent in

## 2022-12-27 NOTE — Telephone Encounter (Signed)
LOV 11/21/22 Next OV not scheduled Last refill 10/10/21, #90, 3 refills  Which means patient should have been out of refills 2 months ago... unless fill schedule is off  Please review, thanks!

## 2022-12-27 NOTE — Telephone Encounter (Signed)
Prescription Request  12/27/2022  Is this a "Controlled Substance" medicine? No  LOV: 11/21/2022  What is the name of the medication or equipment? lamoTRIgine (LAMICTAL) 200 MG tablet   Have you contacted your pharmacy to request a refill? Yes   Which pharmacy would you like this sent to?  [id number] [place], [address] AT [facility] [address] [address] [facility] [address] [place] [address] Phone: [phone] Fax: [phone]    Patient notified that their request is being sent to the clinical staff for review and that they should receive a response within 2 business days.   Please advise at Mobile [phone] (mobile)

## 2022-12-27 NOTE — Telephone Encounter (Signed)
Last visit 11/21/2022 Next No follow up

## 2023-01-30 ENCOUNTER — Other Ambulatory Visit: Payer: Self-pay

## 2023-01-30 DIAGNOSIS — F3170 Bipolar disorder, currently in remission, most recent episode unspecified: Secondary | ICD-10-CM

## 2023-01-30 NOTE — Telephone Encounter (Signed)
Last visit 11/21/2022 Next Per Matt 6 months for CPE and labs

## 2023-01-31 MED ORDER — CARIPRAZINE HCL 3 MG PO CAPS: ORAL_CAPSULE | ORAL | 3 refills | Status: DC

## 2023-08-15 ENCOUNTER — Telehealth: Payer: Self-pay

## 2023-08-15 ENCOUNTER — Other Ambulatory Visit (HOSPITAL_COMMUNITY): Payer: Self-pay

## 2023-08-15 NOTE — Telephone Encounter (Signed)
Pharmacy Patient Advocate Encounter   Received notification from CoverMyMeds that prior authorization for Vraylar 3mg  is required/requested.   Insurance verification completed.   The patient is insured through [employer] [facility] .   Per test claim: PA required; PA submitted to [facility] via CoverMyMeds Key/confirmation #/EOC Key: [id number]    Status is pending

## 2023-08-20 ENCOUNTER — Other Ambulatory Visit (HOSPITAL_COMMUNITY): Payer: Self-pay

## 2023-08-20 NOTE — Telephone Encounter (Signed)
Pharmacy Patient Advocate Encounter  Insurance verification completed.    The patient is insured through [employer]   Ran test claim for [employer]. Currently a quantity of 30 only can be filled at time and was filled on 07/23/23, and is not currently due for a refill.  Also PRIOR AUTHORIZATION EXPIRES ON 08/15/24

## 2023-12-20 ENCOUNTER — Ambulatory Visit: Payer: 59 | Admitting: Nurse Practitioner

## 2023-12-20 ENCOUNTER — Encounter: Payer: Self-pay | Admitting: Nurse Practitioner

## 2023-12-20 VITALS — BP 112/70 | HR 93 | Temp 98.2°F | Ht 64.5 in | Wt 202.0 lb

## 2023-12-20 DIAGNOSIS — Z13220 Encounter for screening for lipoid disorders: Secondary | ICD-10-CM

## 2023-12-20 DIAGNOSIS — F411 Generalized anxiety disorder: Secondary | ICD-10-CM | POA: Diagnosis not present

## 2023-12-20 DIAGNOSIS — Z1159 Encounter for screening for other viral diseases: Secondary | ICD-10-CM

## 2023-12-20 DIAGNOSIS — E669 Obesity, unspecified: Secondary | ICD-10-CM

## 2023-12-20 DIAGNOSIS — Z131 Encounter for screening for diabetes mellitus: Secondary | ICD-10-CM

## 2023-12-20 DIAGNOSIS — Z0000 Encounter for general adult medical examination without abnormal findings: Secondary | ICD-10-CM

## 2023-12-20 DIAGNOSIS — F3170 Bipolar disorder, currently in remission, most recent episode unspecified: Secondary | ICD-10-CM

## 2023-12-20 DIAGNOSIS — Z87891 Personal history of nicotine dependence: Secondary | ICD-10-CM

## 2023-12-20 DIAGNOSIS — Z23 Encounter for immunization: Secondary | ICD-10-CM | POA: Diagnosis not present

## 2023-12-20 MED ORDER — LAMOTRIGINE 200 MG PO TABS: 200.0000 mg | ORAL_TABLET | Freq: Every day | ORAL | 3 refills | Status: DC

## 2023-12-20 MED ORDER — CARIPRAZINE HCL 3 MG PO CAPS: ORAL_CAPSULE | ORAL | 3 refills | Status: DC

## 2023-12-20 NOTE — Assessment & Plan Note (Signed)
Patient currently maintained on Vraylar 3 mg and Lamictal 200 mg daily.  Stable.  Patient denies HI/SI/AVH.

## 2023-12-20 NOTE — Progress Notes (Signed)
Established Patient Office Visit  Subjective   Patient ID: Allison [name], female    DOB: 1976-03-16  Age: 48 y.o. MRN: [id number]  Chief Complaint  Patient presents with   Annual Exam    Pt would like flu shot.     HPI  Bipolar: states that sh eis doing well on the medications. No Hi/Si/AVH. States that she feels tired all the time. States that she will fall asleep at 10 and wake up at 1 and then go to bed and get up at 615  for complete physical and follow up of chronic conditions.  Immunizations: -Tetanus: Completed in 2014 update today -Influenza:  refused  -Shingles: Too young -Pneumonia: Too young  Diet: Fair diet. 3 meals a day. Rarely a snack with weight watchers. She will drink water mostly and one cup of coffee and un sweet tea.  Exercise: No regular exercise.  Eye exam: Completes annually. glasses Dental exam: Needs updating    Colonoscopy: Cologuard 12/08/2022 that was negative Lung Cancer Screening: NA   Pap smear: [facility]  Mammogram: [facility]  Dexa: Too young  Sleep: See above       Review of Systems  Constitutional:  Negative for chills and fever.  Respiratory:  Negative for shortness of breath.   Cardiovascular:  Negative for chest pain and leg swelling.  Gastrointestinal:  Negative for abdominal pain, blood in stool, constipation, diarrhea, nausea and vomiting.       BM daily   Genitourinary:  Negative for dysuria and hematuria.  Neurological:  Negative for tingling and headaches.  Psychiatric/Behavioral:  Negative for hallucinations and suicidal ideas.       Objective:     BP 112/70   Pulse 93   Temp 98.2 F (36.8 C) (Oral)   Ht 5' 4.5" (1.638 m)   Wt 202 lb (91.6 kg)   SpO2 98%   BMI 34.14 kg/m  BP Readings from Last 3 Encounters:  12/20/23 112/70  11/21/22 114/74  09/27/22 124/82   Wt Readings from Last 3 Encounters:  12/20/23 202 lb (91.6 kg)  11/21/22 203 lb 6 oz (92.3 kg)  09/27/22  196 lb (88.9 kg)   SpO2 Readings from Last 3 Encounters:  12/20/23 98%  11/21/22 98%  09/27/22 99%      Physical Exam Vitals and nursing note reviewed.  Constitutional:      Appearance: Normal appearance.  HENT:     Right Ear: Tympanic membrane, ear canal and external ear normal.     Left Ear: Tympanic membrane, ear canal and external ear normal.     Mouth/Throat:     Mouth: Mucous membranes are moist.     Pharynx: Oropharynx is clear.  Eyes:     Extraocular Movements: Extraocular movements intact.     Pupils: Pupils are equal, round, and reactive to light.  Cardiovascular:     Rate and Rhythm: Normal rate and regular rhythm.     Pulses: Normal pulses.     Heart sounds: Normal heart sounds.  Pulmonary:     Effort: Pulmonary effort is normal.     Breath sounds: Normal breath sounds.  Abdominal:     General: Bowel sounds are normal. There is no distension.     Palpations: There is no mass.     Tenderness: There is no abdominal tenderness.     Hernia: No hernia is present.  Musculoskeletal:     Right lower leg: No edema.     Left  lower leg: No edema.  Lymphadenopathy:     Cervical: No cervical adenopathy.  Skin:    General: Skin is warm.  Neurological:     General: No focal deficit present.     Mental Status: She is alert.     Deep Tendon Reflexes:     Reflex Scores:      Bicep reflexes are 2+ on the right side and 2+ on the left side.      Patellar reflexes are 2+ on the right side and 2+ on the left side.    Comments: Bilateral upper and lower extremity strength 5/5  Psychiatric:        Mood and Affect: Mood normal.        Behavior: Behavior normal.        Thought Content: Thought content normal.        Judgment: Judgment normal.      No results found for any visits on 12/20/23.    The 10-year ASCVD risk score (Arnett DK, et al., 2019) is: 0.9%    Assessment & Plan:   Problem List Items Addressed This Visit       Other   GAD (generalized anxiety  disorder)   Patient currently maintained on Vraylar and lamotrigine.  Stable.  Patient has HI/SI/AVH.      Relevant Orders   CBC   Comprehensive metabolic panel   Bipolar disorder (HCC)   Patient currently maintained on Vraylar 3 mg and Lamictal 200 mg daily.  Stable.  Patient denies HI/SI/AVH.      Relevant Medications   cariprazine (VRAYLAR) 3 MG capsule   lamoTRIgine (LAMICTAL) 200 MG tablet   Other Relevant Orders   CBC   Comprehensive metabolic panel   Preventative health care - Primary   Discussed age-appropriate immunizations and screening exams.  Did review patient's personal, surgical, social, family histories.  Patient is up-to-date on all age-appropriate vaccinations she would like.  Update tetanus vaccine and flu vaccine today in office.  Patient is up-to-date on CRC screening.  She sees during women's health for her cervical cancer screening and mammogram screening.  Patient is too young for bone density scanning.  Patient was given information at discharge about preventative healthcare maintenance with anticipatory guidance.      Relevant Orders   CBC   Comprehensive metabolic panel   TSH   Obesity (BMI 30-39.9)   Pending TSH, A1c, lipid panel.  Patient is going start working on healthy lifestyle modifications inclusive of exercise      Former tobacco use   Pending urine microscopy to rule out microscopic hematuria.      Relevant Orders   Urine Microscopic   Other Visit Diagnoses       Encounter for hepatitis C screening test for low risk patient       Relevant Orders   Hepatitis C Antibody     Need for Tdap vaccination       Relevant Orders   Tdap vaccine greater than or equal to 7yo IM (Completed)     Screening for diabetes mellitus       Relevant Orders   Hemoglobin A1c     Screening for lipid disorders       Relevant Orders   Lipid panel     Need for influenza vaccination       Relevant Orders   Flu vaccine trivalent PF, 6mos and  older(Flulaval,Afluria,Fluarix,Fluzone)       Return in about 1 year (around 12/19/2024) for CPE  and Labs.    Audria Nine, NP

## 2023-12-20 NOTE — Assessment & Plan Note (Signed)
Discussed age-appropriate immunizations and screening exams.  Did review patient's personal, surgical, social, family histories.  Patient is up-to-date on all age-appropriate vaccinations she would like.  Update tetanus vaccine and flu vaccine today in office.  Patient is up-to-date on CRC screening.  She sees during women's health for her cervical cancer screening and mammogram screening.  Patient is too young for bone density scanning.  Patient was given information at discharge about preventative healthcare maintenance with anticipatory guidance.

## 2023-12-20 NOTE — Patient Instructions (Signed)
Nice to see you today I will be in touch with the labs once I have them Follow up with me in 1 year, sooner if you need me  We did update your tetanus and flu vaccine today

## 2023-12-20 NOTE — Assessment & Plan Note (Signed)
Pending urine microscopy to rule out microscopic hematuria

## 2023-12-20 NOTE — Assessment & Plan Note (Signed)
Pending TSH, A1c, lipid panel.  Patient is going start working on healthy lifestyle modifications inclusive of exercise

## 2023-12-20 NOTE — Assessment & Plan Note (Signed)
Patient currently maintained on Vraylar and lamotrigine.  Stable.  Patient has HI/SI/AVH.

## 2023-12-21 LAB — LIPID PANEL
Cholesterol: 157 mg/dL (ref 0–200)
HDL: 38.9 mg/dL — ABNORMAL LOW (ref >39.00)
LDL Cholesterol: 102 mg/dL — ABNORMAL HIGH (ref 0–99)
NonHDL: 118.47
Total CHOL/HDL Ratio: 4
Triglycerides: 82 mg/dL (ref 0.0–149.0)
VLDL: 16.4 mg/dL (ref 0.0–40.0)

## 2023-12-21 LAB — COMPREHENSIVE METABOLIC PANEL
ALT: 17 U/L (ref 0–35)
AST: 17 U/L (ref 0–37)
Albumin: 4.8 g/dL (ref 3.5–5.2)
Alkaline Phosphatase: 76 U/L (ref 39–117)
BUN: 10 mg/dL (ref 6–23)
CO2: 29 meq/L (ref 19–32)
Calcium: 10 mg/dL (ref 8.4–10.5)
Chloride: 101 meq/L (ref 96–112)
Creatinine, Ser: 1 mg/dL (ref 0.40–1.20)
GFR: 67.77 mL/min (ref >60.00)
Glucose, Bld: 120 mg/dL — ABNORMAL HIGH (ref 70–99)
Potassium: 4 meq/L (ref 3.5–5.1)
Sodium: 140 meq/L (ref 135–145)
Total Bilirubin: 0.7 mg/dL (ref 0.2–1.2)
Total Protein: 7.5 g/dL (ref 6.0–8.3)

## 2023-12-21 LAB — CBC
HCT: 44.4 % (ref 36.0–46.0)
Hemoglobin: 14.9 g/dL (ref 12.0–15.0)
MCHC: 33.4 g/dL (ref 30.0–36.0)
MCV: 92.3 fL (ref 78.0–100.0)
Platelets: 345 10*3/uL (ref 150.0–400.0)
RBC: 4.82 Mil/uL (ref 3.87–5.11)
RDW: 12.5 % (ref 11.5–15.5)
WBC: 9.7 10*3/uL (ref 4.0–10.5)

## 2023-12-21 LAB — URINALYSIS, MICROSCOPIC ONLY

## 2023-12-21 LAB — TSH: TSH: 1.16 u[IU]/mL (ref 0.35–5.50)

## 2023-12-21 LAB — HEPATITIS C ANTIBODY: Hepatitis C Ab: NONREACTIVE

## 2023-12-21 LAB — HEMOGLOBIN A1C: Hgb A1c MFr Bld: 5.5 % (ref 4.6–6.5)

## 2023-12-24 ENCOUNTER — Encounter: Payer: Self-pay | Admitting: Nurse Practitioner

## 2024-03-20 ENCOUNTER — Ambulatory Visit: Payer: Self-pay

## 2024-03-20 NOTE — Telephone Encounter (Signed)
 Chief Complaint: Ankle injury Symptoms: swelling and bruising to the outside of right ankle Frequency: occurred yesterday Pertinent Negatives: Patient denies being unable to walk on foot Disposition: [] ED /[x] Urgent Care (no appt availability in office) / [] Appointment(In office/virtual)/ []  [place] [facility]/ [] Home Care/ [] Refused Recommended Disposition /[] [place] [facility]/ []  Follow-up with PCP Additional Notes: patient calling to see if she can be seen in office tomorrow. Patient is currently in [place] and reports tripping on broken sidewalk and twisting her right ankle. Patient reports swelling and bruising to outside of ankle. Patient states swelling has decreased for last night but is concerned about swelling. Care Advise given-RICE. Due to patient's concerns, patient is recommended to Urgent Care within the next 24 hours. PCP office is closed on Good Friday which that information was shared with patient. Patient verbalized understanding of plan and all questions answered.    Copied from CRM [id number]. Topic: Clinical - Red Word Triage >> Mar 20, 2024  9:03 AM Marlan Silva wrote: Red Word that prompted transfer to Nurse Triage: Twisted ankle, looking for next day appointment. Reason for Disposition  [1] Limp when walking AND [2] due to a twisted ankle or foot  Answer Assessment - Initial Assessment Questions 1. MECHANISM: "How did the injury happen?" (e.g., twisting injury, direct blow)      Tripped on broken sidewalk 2. ONSET: "When did the injury happen?" (Minutes or hours ago)      Occurred yesterday afternoon 3. LOCATION: "Where is the injury located?"      Outside of ankle 4. APPEARANCE of INJURY: "What does the injury look like?"     Slightly swollen, bruising 5. WEIGHT-BEARING: "Can you put weight on that foot?" "Can you walk (four steps or more)?"       yes 6. SIZE: For cuts, bruises, or swelling, ask: "How large is it?" (e.g., inches or centimeters;  entire  joint)      no 7. PAIN: "Is there pain?" If Yes, ask: "How bad is the pain?"    (e.g., Scale 1-10; or mild, moderate, severe)   - NONE (0): no pain.   - MILD (1-3): doesn't interfere with normal activities.    - MODERATE (4-7): interferes with normal activities (e.g., work or school) or awakens from sleep, limping.    - SEVERE (8-10): excruciating pain, unable to do any normal activities, unable to walk.      2 out of 10 8. TETANUS: For any breaks in the skin, ask: "When was the last tetanus booster?"     N/A 9. OTHER SYMPTOMS: "Do you have any other symptoms?"      no  Protocols used: Ankle and Foot Injury-A-AH

## 2024-03-20 NOTE — Telephone Encounter (Signed)
Noted agree with UC evaluation

## 2024-04-25 ENCOUNTER — Ambulatory Visit: Admitting: Nurse Practitioner

## 2024-04-25 VITALS — BP 102/64 | HR 73 | Temp 98.0°F | Ht 64.5 in | Wt 200.6 lb

## 2024-04-25 DIAGNOSIS — R1031 Right lower quadrant pain: Secondary | ICD-10-CM | POA: Diagnosis not present

## 2024-04-25 LAB — CBC WITH DIFFERENTIAL/PLATELET
Basophils Absolute: 0.1 10*3/uL (ref 0.0–0.1)
Basophils Relative: 1.3 % (ref 0.0–3.0)
Eosinophils Absolute: 0.3 10*3/uL (ref 0.0–0.7)
Eosinophils Relative: 2.8 % (ref 0.0–5.0)
HCT: 42.5 % (ref 36.0–46.0)
Hemoglobin: 14.4 g/dL (ref 12.0–15.0)
Lymphocytes Relative: 26.5 % (ref 12.0–46.0)
Lymphs Abs: 2.4 10*3/uL (ref 0.7–4.0)
MCHC: 34 g/dL (ref 30.0–36.0)
MCV: 91.2 fl (ref 78.0–100.0)
Monocytes Absolute: 0.5 10*3/uL (ref 0.1–1.0)
Monocytes Relative: 5.9 % (ref 3.0–12.0)
Neutro Abs: 5.7 10*3/uL (ref 1.4–7.7)
Neutrophils Relative %: 63.5 % (ref 43.0–77.0)
Platelets: 289 10*3/uL (ref 150.0–400.0)
RBC: 4.66 Mil/uL (ref 3.87–5.11)
RDW: 12.6 % (ref 11.5–15.5)
WBC: 9 10*3/uL (ref 4.0–10.5)

## 2024-04-25 LAB — POCT URINALYSIS DIPSTICK
Bilirubin, UA: NEGATIVE
Blood, UA: NEGATIVE
Glucose, UA: NEGATIVE
Ketones, UA: NEGATIVE
Leukocytes, UA: NEGATIVE
Nitrite, UA: NEGATIVE
Protein, UA: NEGATIVE
Spec Grav, UA: 1.015 (ref 1.010–1.025)
Urobilinogen, UA: 0.2 U/dL
pH, UA: 6 (ref 5.0–8.0)

## 2024-04-25 LAB — COMPREHENSIVE METABOLIC PANEL WITH GFR
ALT: 12 U/L (ref 0–35)
AST: 13 U/L (ref 0–37)
Albumin: 4.3 g/dL (ref 3.5–5.2)
Alkaline Phosphatase: 73 U/L (ref 39–117)
BUN: 12 mg/dL (ref 6–23)
CO2: 29 meq/L (ref 19–32)
Calcium: 9.3 mg/dL (ref 8.4–10.5)
Chloride: 103 meq/L (ref 96–112)
Creatinine, Ser: 0.81 mg/dL (ref 0.40–1.20)
GFR: 87.05 mL/min (ref >60.00)
Glucose, Bld: 99 mg/dL (ref 70–99)
Potassium: 3.7 meq/L (ref 3.5–5.1)
Sodium: 139 meq/L (ref 135–145)
Total Bilirubin: 0.5 mg/dL (ref 0.2–1.2)
Total Protein: 7.4 g/dL (ref 6.0–8.3)

## 2024-04-25 LAB — POCT URINE PREGNANCY: Preg Test, Ur: NEGATIVE

## 2024-04-25 LAB — LIPASE: Lipase: 14 U/L (ref 11.0–59.0)

## 2024-04-25 NOTE — Assessment & Plan Note (Signed)
 Unclear etiology.  Patient is status post appendectomy.  Unable to elicit discomfort in office with leg movement or abdominal palpation.  Will check CBC with differential along with chemistry and lipase.  Patient to continue over-the-counter analgesics signs of reviewed with seek emergent health care.  Query possible hernia at were not able to palpate in office.  Discussed with patient she is in agreement with holding off on CT scan at her current juncture

## 2024-04-25 NOTE — Progress Notes (Signed)
 Acute Office Visit  Subjective:     Patient ID: Allison [name], female    DOB: Apr 15, 1976, 48 y.o.   MRN: [id number]  Chief Complaint  Patient presents with   Abdominal Pain    Pt complains of lower abdominal pain on R side. Pt states of being uncomfortable. Strains when sneezing or sitting up. When pain is active pt feels nauseous.     HPI Patient is in today for abdominal pain with a history of also occurred, GAD, bipolar, obesity, former smoker. Patient states that she was seen by gynecology, looks to be on 01/22/2024 patient that she had ovarian cyst they stated that she was fine.  Unable to review office note  Pain started approx wednesady. States that she has a stabbing double over type of pain. States that she saw the GYn yesterday and US  that did not show cysts on thr gith but had left sided cyst.  States that when the pain hit she had just had a BM. States that the pain is a constant dull pain. If she moves positions or sneeze it hurts more. Same pain that intensifies.   She has tried ibuprofen  and heat. States that heat feels good but does not help with the pain.    Review of Systems  Constitutional:  Positive for chills. Negative for fever.  Respiratory:  Negative for shortness of breath.   Cardiovascular:  Negative for chest pain.  Gastrointestinal:  Positive for abdominal pain and nausea. Negative for constipation, diarrhea and vomiting.       BM daily   Genitourinary:  Negative for dysuria, frequency and hematuria.  Neurological:  Negative for headaches.  Psychiatric/Behavioral:  Negative for hallucinations and suicidal ideas.         Objective:    BP 102/64   Pulse 73   Temp 98 F (36.7 C) (Oral)   Ht 5' 4.5" (1.638 m)   Wt 200 lb 9.6 oz (91 kg)   SpO2 97%   BMI 33.90 kg/m  BP Readings from Last 3 Encounters:  04/25/24 102/64  12/20/23 112/70  11/21/22 114/74   Wt Readings from Last 3 Encounters:  04/25/24 200 lb 9.6 oz (91 kg)  12/20/23 202  lb (91.6 kg)  11/21/22 203 lb 6 oz (92.3 kg)   SpO2 Readings from Last 3 Encounters:  04/25/24 97%  12/20/23 98%  11/21/22 98%      Physical Exam Vitals and nursing note reviewed.  Constitutional:      Appearance: Normal appearance.  Cardiovascular:     Rate and Rhythm: Normal rate and regular rhythm.     Heart sounds: Normal heart sounds.  Pulmonary:     Effort: Pulmonary effort is normal.     Breath sounds: Normal breath sounds.  Abdominal:     General: Bowel sounds are normal. There is no distension.     Palpations: There is no mass.     Tenderness: There is no abdominal tenderness. There is no right CVA tenderness or left CVA tenderness.     Hernia: No hernia is present.  Musculoskeletal:     Lumbar back: No tenderness or bony tenderness. Negative right straight leg raise test.  Neurological:     Mental Status: She is alert.     Deep Tendon Reflexes:     Reflex Scores:      Patellar reflexes are 2+ on the right side and 2+ on the left side.    Comments: Bilateral lower extremities strength 5/5  Results for orders placed or performed in visit on 04/25/24  Comprehensive metabolic panel with GFR  Result Value Ref Range   Sodium 139 135 - 145 mEq/L   Potassium 3.7 3.5 - 5.1 mEq/L   Chloride 103 96 - 112 mEq/L   CO2 29 19 - 32 mEq/L   Glucose, Bld 99 70 - 99 mg/dL   BUN 12 6 - 23 mg/dL   Creatinine, Ser 6.29 0.40 - 1.20 mg/dL   Total Bilirubin 0.5 0.2 - 1.2 mg/dL   Alkaline Phosphatase 73 39 - 117 U/L   AST 13 0 - 37 U/L   ALT 12 0 - 35 U/L   Total Protein 7.4 6.0 - 8.3 g/dL   Albumin 4.3 3.5 - 5.2 g/dL   GFR 52.84 >13.24 mL/min   Calcium 9.3 8.4 - 10.5 mg/dL  Lipase  Result Value Ref Range   Lipase 14.0 11.0 - 59.0 U/L  CBC with Differential/Platelet  Result Value Ref Range   WBC 9.0 4.0 - 10.5 K/uL   RBC 4.66 3.87 - 5.11 Mil/uL   Hemoglobin 14.4 12.0 - 15.0 g/dL   HCT 40.1 02.7 - 25.3 %   MCV 91.2 78.0 - 100.0 fl   MCHC 34.0 30.0 - 36.0 g/dL   RDW  66.4 40.3 - 47.4 %   Platelets 289.0 150.0 - 400.0 K/uL   Neutrophils Relative % 63.5 43.0 - 77.0 %   Lymphocytes Relative 26.5 12.0 - 46.0 %   Monocytes Relative 5.9 3.0 - 12.0 %   Eosinophils Relative 2.8 0.0 - 5.0 %   Basophils Relative 1.3 0.0 - 3.0 %   Neutro Abs 5.7 1.4 - 7.7 K/uL   Lymphs Abs 2.4 0.7 - 4.0 K/uL   Monocytes Absolute 0.5 0.1 - 1.0 K/uL   Eosinophils Absolute 0.3 0.0 - 0.7 K/uL   Basophils Absolute 0.1 0.0 - 0.1 K/uL  POCT urinalysis dipstick  Result Value Ref Range   Color, UA yellow    Clarity, UA clear    Glucose, UA Negative Negative   Bilirubin, UA neg    Ketones, UA neg    Spec Grav, UA 1.015 1.010 - 1.025   Blood, UA neg    pH, UA 6.0 5.0 - 8.0   Protein, UA Negative Negative   Urobilinogen, UA 0.2 0.2 or 1.0 E.U./dL   Nitrite, UA neg    Leukocytes, UA Negative Negative   Appearance     Odor          Assessment & Plan:   Problem List Items Addressed This Visit       Other   Right lower quadrant abdominal pain - Primary   Unclear etiology.  Patient is status post appendectomy.  Unable to elicit discomfort in office with leg movement or abdominal palpation.  Will check CBC with differential along with chemistry and lipase.  Patient to continue over-the-counter analgesics signs of reviewed with seek emergent health care.  Query possible hernia at were not able to palpate in office.  Discussed with patient she is in agreement with holding off on CT scan at her current juncture      Relevant Orders   POCT urinalysis dipstick (Completed)   POCT urine pregnancy   Comprehensive metabolic panel with GFR (Completed)   Lipase (Completed)   CBC with Differential/Platelet (Completed)    No orders of the defined types were placed in this encounter.   Return if symptoms worsen or fail to improve.  Margarie Shay, NP

## 2024-04-25 NOTE — Patient Instructions (Signed)
 Nice to see you today Continue using heat/ice and otc analgesics as needed I will be in touch with the labs once I have them

## 2024-04-26 ENCOUNTER — Ambulatory Visit: Payer: Self-pay | Admitting: Nurse Practitioner

## 2024-04-29 ENCOUNTER — Telehealth: Payer: Self-pay | Admitting: Nurse Practitioner

## 2024-04-29 NOTE — Telephone Encounter (Signed)
-----   Message from [facility] sent at 04/26/2024  9:23 AM EDT ----- Regarding: RLQ See how RLQ pain is doing

## 2024-04-29 NOTE — Telephone Encounter (Signed)
 Can we call and see how the right lower quadrant pain is doing

## 2024-04-29 NOTE — Telephone Encounter (Signed)
 Contacted pt.  Pt complains of doing much better with pain in RLQ.  Pt says "I woke up Saturday and felt like a whole new person"  Pt has no questions or concerns.

## 2024-06-09 ENCOUNTER — Encounter: Payer: Self-pay | Admitting: Nurse Practitioner

## 2024-07-03 ENCOUNTER — Ambulatory Visit: Admitting: Nurse Practitioner

## 2024-07-17 ENCOUNTER — Other Ambulatory Visit (HOSPITAL_COMMUNITY): Payer: Self-pay

## 2024-07-17 ENCOUNTER — Telehealth: Payer: Self-pay

## 2024-07-17 NOTE — Telephone Encounter (Signed)
 Pharmacy Patient Advocate Encounter   Received notification from CoverMyMeds that prior authorization for Vraylar  3 caps is required/requested.   Insurance verification completed.   The patient is insured through [facility] .   Per test claim: The current 30 day co-pay is, $0.00.  No PA needed at this time. This test claim was processed through [facility]- copay amounts may vary at other pharmacies due to pharmacy/plan contracts, or as the patient moves through the different stages of their insurance plan.   Patient has quantity limits of 30 caps per 30 days. Current PA expires 08/15/24

## 2024-07-21 ENCOUNTER — Other Ambulatory Visit (HOSPITAL_COMMUNITY): Payer: Self-pay

## 2024-07-21 NOTE — Telephone Encounter (Signed)
 Pharmacy Patient Advocate Encounter   Received notification from Onbase that prior authorization for Vraylar  3 is due for renewal.   Insurance verification completed.   The patient is insured through [facility].  Action: PA required; PA submitted to above mentioned insurance via Latent Key/confirmation #/EOC [id number] Status is pending

## 2024-07-21 NOTE — Telephone Encounter (Signed)
 Pharmacy Patient Advocate Encounter  Received notification from [facility] that Prior Authorization for Vraylar  3 has been APPROVED from 07/21/24 to 07/21/25. Ran test claim, Copay is $0.00. This test claim was processed through [facility]- copay amounts may vary at other pharmacies due to pharmacy/plan contracts, or as the patient moves through the different stages of their insurance plan.   PA #/Case ID/Reference #: # [id number]

## 2024-08-18 ENCOUNTER — Encounter: Payer: Self-pay | Admitting: Family Medicine

## 2024-08-18 ENCOUNTER — Ambulatory Visit: Admitting: Family Medicine

## 2024-08-18 ENCOUNTER — Ambulatory Visit: Payer: Self-pay

## 2024-08-18 VITALS — BP 118/66 | HR 90 | Temp 97.8°F | Ht 64.5 in | Wt 206.0 lb

## 2024-08-18 DIAGNOSIS — N10 Acute tubulo-interstitial nephritis: Secondary | ICD-10-CM

## 2024-08-18 DIAGNOSIS — R350 Frequency of micturition: Secondary | ICD-10-CM | POA: Diagnosis not present

## 2024-08-18 LAB — POC URINALSYSI DIPSTICK (AUTOMATED)
Bilirubin, UA: NEGATIVE
Blood, UA: POSITIVE
Glucose, UA: NEGATIVE
Ketones, UA: NEGATIVE
Nitrite, UA: NEGATIVE
Protein, UA: NEGATIVE
Spec Grav, UA: 1.01 (ref 1.010–1.025)
Urobilinogen, UA: 4 U/dL — AB
pH, UA: 7 (ref 5.0–8.0)

## 2024-08-18 MED ORDER — CIPROFLOXACIN HCL 500 MG PO TABS: 500.0000 mg | ORAL_TABLET | Freq: Two times a day (BID) | ORAL | 0 refills | Status: AC

## 2024-08-18 NOTE — Telephone Encounter (Signed)
 noted

## 2024-08-18 NOTE — Progress Notes (Signed)
 Argusta Mcgann T. Dajanee Voorheis, MD, CAQ Sports Medicine [facility] [address] [place], [address]  Phone: [phone]  FAX: [phone]  Allison [name] - 48 y.o. female  MRN [id number]  Date of Birth: [birth year]  Date: 08/18/2024  PCP: Wendee Lynwood HERO, NP  Referral: Wendee Lynwood HERO, NP  Chief Complaint  Patient presents with   Urinary Frequency    Urinary frequency, back pain, pain/pressure in bladder  Took Azo over the weekend  Sx started Friday evening    Subjective:   Allison [name] is a 48 y.o. very pleasant female patient with Body mass index is 34.81 kg/m. who presents with the following:  Discussed the use of AI scribe software for clinical note transcription with the patient, who gave verbal consent to proceed.  History of Present Illness Allison [name] is a 48 year old female who presents with back pain and urinary symptoms.  She experiences significant back pain originating in the flank area, radiating downwards, and causing considerable discomfort.  She has urinary symptoms characterized by a constant feeling of urgency with minimal urine output. There is no dysuria, but the urgency and frequency are distressing.  She has not had a urinary tract infection in the past twenty years and has avoided taking AZO since Saturday to prevent obscuring urine test results.  No history of kidney stones, and she has never experienced them before. She reports lower abdominal pain when asked.  No pain during urination but a constant feeling of needing to urinate. She denies any history of kidney stones and reports lower abdominal pain when asked.    Review of Systems is noted in the HPI, as appropriate  Objective:   BP 118/66   Pulse 90   Temp 97.8 F (36.6 C) (Temporal)   Ht 5' 4.5 (1.638 m)   Wt 206 lb (93.4 kg)   SpO2 98%   BMI 34.81 kg/m    GEN: WDWN HEENT: Atraumatc, normocephalic. CV: RRR, No  M/G/R PULM: CTA B, No wheezes, crackles, or rhonchi ABD: S, NT, ND, +BS, no rebound. + CVAT. mild suprapubic tenderness.   Physical Exam   Laboratory and Imaging Data: Results for orders placed or performed in visit on 08/18/24  POCT Urinalysis Dipstick (Automated)   Collection Time: 08/18/24  9:02 AM  Result Value Ref Range   Color, UA yellow    Clarity, UA clear    Glucose, UA Negative Negative   Bilirubin, UA neg    Ketones, UA neg    Spec Grav, UA 1.010 1.010 - 1.025   Blood, UA POS    pH, UA 7.0 5.0 - 8.0   Protein, UA Negative Negative   Urobilinogen, UA 4.0 (A) 0.2 or 1.0 E.U./dL   Nitrite, UA neg    Leukocytes, UA Large (3+) (A) Negative     Assessment and Plan:     ICD-10-CM   1. Acute pyelonephritis  N10     2. Urinary frequency  R35.0 POCT Urinalysis Dipstick (Automated)     Assessment & Plan Urinary tract infection with urinary frequency and back pain Leukocyte esterase, nitrites, and blood in urine indicate UTI. Possible progression to pyelonephritis due to back pain. Kidney stones less likely. - Prescribe ciprofloxacin  500 mg twice daily for 10 days. - Perform urine culture for antibiotic resistance. - Advise against high-impact activities due to ciprofloxacin .  Medication Management during today's office visit: Meds ordered this encounter  Medications   ciprofloxacin  (  CIPRO ) 500 MG tablet    Sig: Take 1 tablet (500 mg total) by mouth 2 (two) times daily for 10 days.    Dispense:  20 tablet    Refill:  0   There are no discontinued medications.  Orders placed today for conditions managed today: Orders Placed This Encounter  Procedures   POCT Urinalysis Dipstick (Automated)    Disposition: No follow-ups on file.  Dragon [employer] One speech-to-text software was used for transcription in this dictation.  Possible transcriptional errors can occur using Animal nutritionist.   Signed,  Jacques DASEN. Avyukt Cimo, MD   Outpatient Encounter Medications as of  08/18/2024  Medication Sig   cariprazine  (VRAYLAR ) 3 MG capsule TAKE ONE CAPSULE BY MOUTH ONE TIME DAILY (DISCONTINUE 4.5MG  DOSE)   ciprofloxacin  (CIPRO ) 500 MG tablet Take 1 tablet (500 mg total) by mouth 2 (two) times daily for 10 days.   lamoTRIgine  (LAMICTAL ) 200 MG tablet Take 1 tablet (200 mg total) by mouth daily. TAKE ONE TABLET BY MOUTH EVERY MORNING   ALPRAZolam (XANAX) 1 MG tablet TAKE ONE TABLET BY MOUTH ONE TIME DAILY AS DIRECTED (Patient not taking: Reported on 08/18/2024)   No facility-administered encounter medications on file as of 08/18/2024.

## 2024-08-18 NOTE — Addendum Note (Signed)
 Addended by: NARCISO ANDREZ BROCKS on: 08/18/2024 09:20 AM   Modules accepted: Orders

## 2024-08-18 NOTE — Telephone Encounter (Signed)
 FYI Only or Action Required?: FYI only for provider.  Patient was last seen in primary care on 04/25/2024 by Wendee Lynwood HERO, NP.  Called Nurse Triage reporting Urinary Frequency.  Symptoms began several days ago.  Interventions attempted: Nothing.  Symptoms are: gradually worsening.  Triage Disposition: See HCP Within 4 Hours (Or PCP Triage)  Patient/caregiver understands and will follow disposition?: Yes    Copied from CRM [id number]. Topic: Clinical - Red Word Triage >> Aug 18, 2024  8:05 AM Larissa RAMAN wrote: [facility] that prompted transfer to Nurse Triage: back pain, urgency and frequency with urination Reason for Disposition  Side (flank) or lower back pain present  Answer Assessment - Initial Assessment Questions 1. SYMPTOM: What's the main symptom you're concerned about? (e.g., frequency, incontinence)     Frequency, back pain,  2. ONSET: When did the  frequency  start?     Friday 3. PAIN: Is there any pain? If Yes, ask: How bad is it? (Scale: 1-10; mild, moderate, severe)     denies 4. CAUSE: What do you think is causing the symptoms?     uti 5. OTHER SYMPTOMS: Do you have any other symptoms? (e.g., blood in urine, fever, flank pain, pain with urination)     urgency 6. PREGNANCY: Is there any chance you are pregnant? When was your last menstrual period?     na  Protocols used: Urinary Symptoms-A-AH

## 2024-08-20 ENCOUNTER — Ambulatory Visit: Payer: Self-pay | Admitting: Family Medicine

## 2024-08-20 LAB — URINE CULTURE
MICRO NUMBER:: 16968033
SPECIMEN QUALITY:: ADEQUATE

## 2024-08-20 MED ORDER — AMOXICILLIN-POT CLAVULANATE 875-125 MG PO TABS: 1.0000 | ORAL_TABLET | Freq: Two times a day (BID) | ORAL | 0 refills | Status: AC

## 2024-08-20 MED ORDER — AMOXICILLIN-POT CLAVULANATE 875-125 MG PO TABS: 1.0000 | ORAL_TABLET | Freq: Two times a day (BID) | ORAL | 0 refills | Status: DC

## 2024-08-20 NOTE — Addendum Note (Signed)
 Addended by: WATT MIRZA on: 08/20/2024 12:28 PM   Modules accepted: Orders

## 2024-08-20 NOTE — Addendum Note (Signed)
 Addended by: WATT MIRZA on: 08/20/2024 12:27 PM   Modules accepted: Orders

## 2024-08-27 ENCOUNTER — Emergency Department
Admission: EM | Admit: 2024-08-27 | Discharge: 2024-08-27 | Disposition: A | Discharge status: Home / Self Care | Source: Ambulatory Visit | Attending: Emergency Medicine | Admitting: Emergency Medicine

## 2024-08-27 ENCOUNTER — Other Ambulatory Visit: Payer: Self-pay

## 2024-08-27 ENCOUNTER — Emergency Department

## 2024-08-27 ENCOUNTER — Ambulatory Visit: Payer: Self-pay

## 2024-08-27 ENCOUNTER — Encounter: Payer: Self-pay | Admitting: Emergency Medicine

## 2024-08-27 DIAGNOSIS — K625 Hemorrhage of anus and rectum: Secondary | ICD-10-CM | POA: Insufficient documentation

## 2024-08-27 DIAGNOSIS — K6289 Other specified diseases of anus and rectum: Secondary | ICD-10-CM

## 2024-08-27 LAB — CBC
HCT: 45.9 % (ref 36.0–46.0)
Hemoglobin: 15.2 g/dL — ABNORMAL HIGH (ref 12.0–15.0)
MCH: 30.2 pg (ref 26.0–34.0)
MCHC: 33.1 g/dL (ref 30.0–36.0)
MCV: 91.3 fL (ref 80.0–100.0)
Platelets: 385 K/uL (ref 150–400)
RBC: 5.03 MIL/uL (ref 3.87–5.11)
RDW: 11.8 % (ref 11.5–15.5)
WBC: 13.8 K/uL — ABNORMAL HIGH (ref 4.0–10.5)
nRBC: 0 % (ref 0.0–0.2)

## 2024-08-27 LAB — TYPE AND SCREEN
ABO/RH(D): O NEG
Antibody Screen: NEGATIVE

## 2024-08-27 LAB — COMPREHENSIVE METABOLIC PANEL WITH GFR
ALT: 17 U/L (ref 0–44)
AST: 23 U/L (ref 15–41)
Albumin: 4.1 g/dL (ref 3.5–5.0)
Alkaline Phosphatase: 71 U/L (ref 38–126)
Anion gap: 14 (ref 5–15)
BUN: 11 mg/dL (ref 6–20)
CO2: 25 mmol/L (ref 22–32)
Calcium: 9.3 mg/dL (ref 8.9–10.3)
Chloride: 103 mmol/L (ref 98–111)
Creatinine, Ser: 0.93 mg/dL (ref 0.44–1.00)
GFR, Estimated: >60 mL/min (ref >60)
Glucose, Bld: 104 mg/dL — ABNORMAL HIGH (ref 70–99)
Potassium: 3.7 mmol/L (ref 3.5–5.1)
Sodium: 142 mmol/L (ref 135–145)
Total Bilirubin: 0.6 mg/dL (ref 0.0–1.2)
Total Protein: 7.7 g/dL (ref 6.5–8.1)

## 2024-08-27 LAB — POC URINE PREG, ED: Preg Test, Ur: NEGATIVE

## 2024-08-27 LAB — SEDIMENTATION RATE: Sed Rate: 8 mm/h (ref 0–20)

## 2024-08-27 LAB — C-REACTIVE PROTEIN: CRP: 0.7 mg/dL (ref <1.0)

## 2024-08-27 MED ORDER — ONDANSETRON 4 MG PO TBDP: 4.0000 mg | ORAL_TABLET | Freq: Three times a day (TID) | ORAL | 0 refills | Status: AC | PRN

## 2024-08-27 MED ORDER — ACETAMINOPHEN 500 MG PO TABS
1000.0000 mg | ORAL_TABLET | Freq: Once | ORAL | Status: AC
  Administered 2024-08-27: 1000 mg via ORAL
  Filled 2024-08-27: qty 2

## 2024-08-27 MED ORDER — METRONIDAZOLE 500 MG PO TABS: 500.0000 mg | ORAL_TABLET | Freq: Three times a day (TID) | ORAL | 0 refills | Status: AC

## 2024-08-27 MED ORDER — IOHEXOL 300 MG/ML  SOLN
100.0000 mL | Freq: Once | INTRAMUSCULAR | Status: AC | PRN
  Administered 2024-08-27: 100 mL via INTRAVENOUS

## 2024-08-27 NOTE — ED Triage Notes (Signed)
 Patient to ED via POV for lower abd pain. States she did have 1 BM today with a blood clot. States she is also having diarrhea. NAD noted.

## 2024-08-27 NOTE — Telephone Encounter (Signed)
 FYI Only or Action Required?: FYI only for provider.  Patient was last seen in primary care on 08/18/2024 by Watt Mirza, MD.  Called Nurse Triage reporting Rectal Bleeding.  Symptoms began today.  Interventions attempted: OTC medications: pepto.  Symptoms are: gradually worsening.  Triage Disposition: Go to ED Now (Notify PCP)  Patient/caregiver understands and will follow disposition?: Yes   Copied from CRM #[id number]. Topic: Clinical - Red Word Triage >> Aug 27, 2024  3:08 PM Paige D wrote: Red Word that prompted transfer to Nurse Triage: Pt has had upset stomach went to restroom bowel movement and wiped and a clot of blood came out. Pt took tempeture it is 99.0 pt states its a little high for her Reason for Disposition  [1] Constant abdominal pain AND [2] present > 2 hours  Answer Assessment - Initial Assessment Questions Additional info: She needed to leave work early today due to diarrhea and abdominal pain, when she return home she had another diarrhea bowel movement with bright red, dark red and blood clot.    1. APPEARANCE of BLOOD: What color is it? Is it passed separately, on the surface of the stool, or mixed in with the stool?      Clot after bowel movement, bright and dark red blood on toilet paper 2. AMOUNT: How much blood was passed?      Clot size of a nail head, blood on toilet paper 3. FREQUENCY: How many times has blood been passed with the stools?      Once today 4. ONSET: When was the blood first seen in the stools? (Days or weeks)      today 5. DIARRHEA: Is there also some diarrhea? If Yes, ask: How many diarrhea stools in the past 24 hours?      3 6. CONSTIPATION: Do you have constipation? If Yes, ask: How bad is it?     no 7. RECURRENT SYMPTOMS: Have you had blood in your stools before? If Yes, ask: When was the last time? and What happened that time?      no 8. BLOOD THINNERS: Do you take any blood thinners? (e.g.,  aspirin, clopidogrel / Plavix, coumadin, heparin). Notes: Other strong blood thinners include: Arixtra (fondaparinux), Eliquis (apixaban), Pradaxa (dabigatran), and Xarelto (rivaroxaban).     no 9. OTHER SYMPTOMS: Do you have any other symptoms?  (e.g., abdomen pain, vomiting, dizziness, fever)     Abdominal pain  Protocols used: Rectal Bleeding-A-AH

## 2024-08-27 NOTE — Telephone Encounter (Signed)
Agree with being evaluated  

## 2024-08-27 NOTE — Discharge Instructions (Signed)
 You were seen in the emergency department for rectal bleeding.  You had a CT scan that showed inflammation to the end of your colon.  Your hemoglobin level was normal, do not believe that you are bleeding a significant amount.  It is important that you follow-up closely with gastroenterology as you need a colonoscopy for further workup.  Call their number that was provided.  Alternate Motrin  and Tylenol  for pain control.  You were started on antibiotic called Flagyl .  Do not drink any alcohol while on Flagyl  it will make you very sick.  Pain control:  Ibuprofen  (motrin /aleve /advil ) - You can take 3 tablets (600 mg) every 6 hours as needed for pain/fever.  Acetaminophen  (tylenol ) - You can take 2 extra strength tablets (1000 mg) every 6 hours as needed for pain/fever.  You can alternate these medications or take them together.  Make sure you eat food/drink water when taking these medications.  zofran  (ondansetron ) - nausea medication, take 1 tablet every 8 hours as needed for nausea/vomiting.

## 2024-08-27 NOTE — ED Provider Notes (Signed)
 [facility] Provider Note    None    (approximate)   History   Rectal Bleeding   HPI  Allison [name] is a 48 y.o. female no significant past medical history who presents to the emergency department following an episode of rectal bleeding.  Patient states that today she had 3 episodes of bright red blood when having a bowel movement.  Feels like she needs to have a bowel movement and is having some pain with bowel movements.  No prior similar episodes.  Complaining of diffuse abdominal cramping.  No fever or chills.  Endorses some nausea but no episodes of vomiting.  Not on anticoagulation.  No personal or family history of IBD.  Denies anal intercourse.  Prior colonoscopy approximately 15 years ago that was normal, she is uncertain of her symptoms that caused her to have an early colonoscopy.  Denies any family history of IBD.  No known family history of early rectal cancer     Physical Exam   Triage Vital Signs: ED Triage Vitals  Encounter Vitals Group     BP 08/27/24 1549 109/87     Girls Systolic BP Percentile --      Girls Diastolic BP Percentile --      Boys Systolic BP Percentile --      Boys Diastolic BP Percentile --      Pulse Rate 08/27/24 1549 (!) 104     Resp 08/27/24 1549 17     Temp 08/27/24 1549 98.3 F (36.8 C)     Temp Source 08/27/24 1549 Oral     SpO2 08/27/24 1549 96 %     Weight 08/27/24 1550 206 lb (93.4 kg)     Height 08/27/24 1550 5' 4 (1.626 m)     Head Circumference --      Peak Flow --      Pain Score 08/27/24 1550 3     Pain Loc --      Pain Education --      Exclude from Growth Chart --     Most recent vital signs: Vitals:   08/27/24 2000 08/27/24 2006  BP: 103/81   Pulse: 75   Resp: 20   Temp:  98 F (36.7 C)  SpO2: 98%     Physical Exam Exam conducted with a chaperone present.  Constitutional:      Appearance: She is well-developed.  HENT:     Head: Atraumatic.  Eyes:     Conjunctiva/sclera:  Conjunctivae normal.  Cardiovascular:     Rate and Rhythm: Regular rhythm.  Pulmonary:     Effort: No respiratory distress.  Abdominal:     General: There is no distension.     Tenderness: There is abdominal tenderness (Mild diffuse abdominal tenderness to palpation with no rebound or guarding).  Genitourinary:    Comments: Rectal exam with no significant blood or stool in the rectal vault.  No obvious fissures or tears.  No palpable masses.  No significant tenderness to palpation Musculoskeletal:        General: Normal range of motion.     Cervical back: Normal range of motion.  Skin:    General: Skin is warm.     Capillary Refill: Capillary refill takes less than 2 seconds.  Neurological:     Mental Status: She is alert. Mental status is at baseline.  Psychiatric:        Mood and Affect: Mood normal.     IMPRESSION / MDM /  ASSESSMENT AND PLAN / ED COURSE  I reviewed the triage vital signs and the nursing notes.  Differential diagnosis including infectious diarrhea, IBD, malignancy, colitis  No tachycardic or bradycardic dysrhythmias while on cardiac telemetry.  RADIOLOGY CT scan abdomen and pelvis with contrast showed mucosal hyperemia to the distal rectum concerning for infectious versus inflammatory proctitis LABS (all labs ordered are listed, but only abnormal results are displayed) Labs interpreted as -    Labs Reviewed  COMPREHENSIVE METABOLIC PANEL WITH GFR - Abnormal; Notable for the following components:      Result Value   Glucose, Bld 104 (*)    All other components within normal limits  CBC - Abnormal; Notable for the following components:   WBC 13.8 (*)    Hemoglobin 15.2 (*)    All other components within normal limits  SEDIMENTATION RATE  C-REACTIVE PROTEIN  POC URINE PREG, ED  TYPE AND SCREEN     MDM  Workup overall unremarkable.  No significant leukocytosis or anemia.  Creatinine is at her baseline.  No significant electrolyte abnormality.   Pregnancy test is negative.  No findings of a urinary tract infection.  ESR and CRP negative.  Discussed the patient's case with gastroenterology, recommended Flagyl .  Will see her in close follow-up to discuss colonoscopy.  Will hold on any further medications at this time.  Discussed return precautions with the patient.  No questions at time of discharge.     PROCEDURES:  Critical Care performed: No  Procedures  Patient's presentation is most consistent with acute complicated illness / injury requiring diagnostic workup.   MEDICATIONS ORDERED IN ED: Medications  acetaminophen  (TYLENOL ) tablet 1,000 mg (1,000 mg Oral Given 08/27/24 1928)  iohexol  (OMNIPAQUE ) 300 MG/ML solution 100 mL (100 mLs Intravenous Contrast Given 08/27/24 2017)    FINAL CLINICAL IMPRESSION(S) / ED DIAGNOSES   Final diagnoses:  Rectal bleeding  Proctitis     Rx / DC Orders   ED Discharge Orders          Ordered    metroNIDAZOLE  (FLAGYL ) 500 MG tablet  3 times daily        08/27/24 2103    ondansetron  (ZOFRAN -ODT) 4 MG disintegrating tablet  Every 8 hours PRN        08/27/24 2103             Note:  This document was prepared using Dragon voice recognition software and may include unintentional dictation errors.   Suzanne Kirsch, MD 08/30/24 1453

## 2024-08-28 ENCOUNTER — Encounter: Payer: Self-pay | Admitting: Nurse Practitioner

## 2024-08-28 NOTE — Telephone Encounter (Signed)
 Can we get her in my next available for hospital follow up

## 2024-08-28 NOTE — Telephone Encounter (Signed)
 Ok to set up with next open appointment?

## 2024-09-01 ENCOUNTER — Ambulatory Visit: Admitting: Nurse Practitioner

## 2024-09-01 VITALS — BP 116/72 | HR 68 | Temp 97.8°F | Ht 64.0 in | Wt 206.2 lb

## 2024-09-01 DIAGNOSIS — R197 Diarrhea, unspecified: Secondary | ICD-10-CM | POA: Diagnosis not present

## 2024-09-01 DIAGNOSIS — R1084 Generalized abdominal pain: Secondary | ICD-10-CM

## 2024-09-01 DIAGNOSIS — N3090 Cystitis, unspecified without hematuria: Secondary | ICD-10-CM

## 2024-09-01 DIAGNOSIS — Z09 Encounter for follow-up examination after completed treatment for conditions other than malignant neoplasm: Secondary | ICD-10-CM

## 2024-09-01 LAB — POCT URINALYSIS DIPSTICK
Bilirubin, UA: NEGATIVE
Blood, UA: NEGATIVE
Glucose, UA: NEGATIVE
Ketones, UA: NEGATIVE
Nitrite, UA: NEGATIVE
Protein, UA: NEGATIVE
Spec Grav, UA: 1.01 (ref 1.010–1.025)
Urobilinogen, UA: 2 U/dL — AB
pH, UA: 6 (ref 5.0–8.0)

## 2024-09-01 NOTE — Progress Notes (Signed)
 Established Patient Office Visit  Subjective   Patient ID: Allison [name], female    DOB: [birth year]  Age: 48 y.o. MRN: [id number]  Chief Complaint  Patient presents with   Hospitalization Follow-up    Pt complains of not feeling well. Pt states she is unsure if UTI has gone away due to ongoing back pain. Pt still has diarrhea and had slight rectal bleeding yesterday. Abdominal cramping    HPI   Discussed the use of AI scribe software for clinical note transcription with the patient, who gave verbal consent to proceed.  History of Present Illness Leatta Alewine is a 48 year old female who presents with ongoing gastrointestinal symptoms following a UTI treatment.  She initially received treatment for a urinary tract infection with Cipro , but due to resistance, her medication was switched to Augmentin . She has two doses remaining of the Augmentin  course.  On September 24, she experienced bloody stools, pain, and cramping, and was told she had proctitis during her hospital visit. She was prescribed Flagyl  and submitted stool samples to rule out infections such as C. diff.  Since the hospital visit, she has had persistent diarrhea for almost a week, with three bowel movements today and eight yesterday. The diarrhea is lessening but remains irregular. She experiences cramping described as 'wiggly' and uncomfortable, with no passage of gas or burping. She has a lack of appetite, feeling hungry but unable to eat much.  She reports upper back pain, described as dull and persistent, with no clear triggers or relief from bowel movements or urination. She has avoided NSAIDs due to stomach concerns and found no significant relief with Tylenol .  No fever, but she experiences hot and cold sensations, with cold arms and a hot face. She feels lightheaded, especially when standing quickly, and has been consuming water and Gatorade to prevent dehydration.    Review of Systems   Constitutional:  Positive for chills. Negative for fever.  Respiratory:  Negative for shortness of breath.   Cardiovascular:  Negative for chest pain.  Gastrointestinal:  Positive for abdominal pain and diarrhea. Negative for blood in stool, nausea and vomiting.  Neurological:  Positive for dizziness.      Objective:     BP 116/72   Pulse 68   Temp 97.8 F (36.6 C) (Oral)   Ht 5' 4 (1.626 m)   Wt 206 lb 3.2 oz (93.5 kg)   SpO2 97%   BMI 35.39 kg/m  BP Readings from Last 3 Encounters:  09/01/24 116/72  08/27/24 103/81  08/18/24 118/66   Wt Readings from Last 3 Encounters:  09/01/24 206 lb 3.2 oz (93.5 kg)  08/27/24 206 lb (93.4 kg)  08/18/24 206 lb (93.4 kg)   SpO2 Readings from Last 3 Encounters:  09/01/24 97%  08/27/24 98%  08/18/24 98%      Physical Exam Vitals and nursing note reviewed.  Constitutional:      Appearance: Normal appearance.  Cardiovascular:     Rate and Rhythm: Normal rate and regular rhythm.     Heart sounds: Normal heart sounds.  Pulmonary:     Effort: Pulmonary effort is normal.     Breath sounds: Normal breath sounds.  Chest:     Chest wall: Tenderness present.    Abdominal:     General: Bowel sounds are normal. There is no distension.     Palpations: There is no mass.     Tenderness: There is abdominal tenderness. There is no right CVA tenderness  or left CVA tenderness.     Hernia: No hernia is present.  Neurological:     Mental Status: She is alert.      Results for orders placed or performed in visit on 09/01/24  POCT urinalysis dipstick  Result Value Ref Range   Color, UA yellow    Clarity, UA clear    Glucose, UA Negative Negative   Bilirubin, UA neg    Ketones, UA neg    Spec Grav, UA 1.010 1.010 - 1.025   Blood, UA neg    pH, UA 6.0 5.0 - 8.0   Protein, UA Negative Negative   Urobilinogen, UA 2.0 (A) 0.2 or 1.0 E.U./dL   Nitrite, UA neg    Leukocytes, UA Trace (A) Negative   Appearance     Odor         The 10-year ASCVD risk score (Arnett DK, et al., 2019) is: 0.8%    Assessment & Plan:   Problem List Items Addressed This Visit   None Visit Diagnoses       Hospital discharge follow-up    -  Primary   Relevant Orders   CBC   Basic metabolic panel with GFR     Generalized abdominal pain       Relevant Orders   POCT urinalysis dipstick (Completed)   Urine Culture   CBC   Basic metabolic panel with GFR     Diarrhea of presumed infectious origin       Relevant Orders   CBC   Basic metabolic panel with GFR     Cystitis       Relevant Orders   Urine Culture     Assessment and Plan Assessment & Plan Proctitis with diarrhea and generalized abdominal pain Ongoing diarrhea and abdominal cramping with reduced frequency. Appetite decreased, likely due to discomfort. No significant blood in stools, bright red blood likely from irritation. - Continue metronidazole  as prescribed. - Await stool study results to rule out infections, including C. difficile. - Proceed with colonoscopy. - Report significant changes, such as increased blood in stools. - did review ED note, imaging, and labs   Volume depletion Possible volume depletion due to diarrhea. Adequate fluid intake reported, but symptoms like lightheadedness suggest volume depletion. - Order blood work to assess hydration and kidney function. - Encourage continued fluid intake.  Urinary tract infection UTI initially treated with ciprofloxacin , switched to Augmentin  due to resistance. Urination normal, no significant pain. - Complete Augmentin  course. - Send urine for culture to confirm UTI resolution.  Upper back pain Persistent dull upper back pain, not related to bowel movements or urination. Tylenol  ineffective, NSAIDs avoided due to GI concerns. - Consider alternative pain management, avoiding NSAIDs. - Reassess if pain persists or worsens.  -Query costochondritis as she was recently ill   Return in about 4  months (around 01/01/2025) for CPE and Labs.    Adina Crandall, NP

## 2024-09-01 NOTE — Patient Instructions (Addendum)
 Nice to see you today  Follow up with me in 4 months for your physical  Let me know what Dr Toldeo says and I will see you prior to your physical

## 2024-09-02 LAB — CBC
HCT: 41.2 % (ref 36.0–46.0)
Hemoglobin: 14 g/dL (ref 12.0–15.0)
MCHC: 33.9 g/dL (ref 30.0–36.0)
MCV: 90.6 fl (ref 78.0–100.0)
Platelets: 363 K/uL (ref 150.0–400.0)
RBC: 4.54 Mil/uL (ref 3.87–5.11)
RDW: 12.1 % (ref 11.5–15.5)
WBC: 6.5 K/uL (ref 4.0–10.5)

## 2024-09-02 LAB — BASIC METABOLIC PANEL WITH GFR
BUN: 7 mg/dL (ref 6–23)
CO2: 29 meq/L (ref 19–32)
Calcium: 9.4 mg/dL (ref 8.4–10.5)
Chloride: 103 meq/L (ref 96–112)
Creatinine, Ser: 0.82 mg/dL (ref 0.40–1.20)
GFR: 85.57 mL/min (ref >60.00)
Glucose, Bld: 95 mg/dL (ref 70–99)
Potassium: 4.2 meq/L (ref 3.5–5.1)
Sodium: 139 meq/L (ref 135–145)

## 2024-09-02 LAB — URINE CULTURE
MICRO NUMBER:: 17029686
Result:: NO GROWTH
SPECIMEN QUALITY:: ADEQUATE

## 2024-09-03 ENCOUNTER — Ambulatory Visit: Payer: Self-pay | Admitting: Nurse Practitioner

## 2024-09-08 ENCOUNTER — Ambulatory Visit: Payer: Self-pay

## 2024-09-08 DIAGNOSIS — K640 First degree hemorrhoids: Secondary | ICD-10-CM | POA: Diagnosis not present

## 2024-09-08 DIAGNOSIS — K529 Noninfective gastroenteritis and colitis, unspecified: Secondary | ICD-10-CM | POA: Diagnosis present

## 2024-09-08 DIAGNOSIS — K921 Melena: Secondary | ICD-10-CM | POA: Diagnosis not present

## 2024-09-09 ENCOUNTER — Encounter: Payer: Self-pay | Admitting: Nurse Practitioner

## 2024-09-17 ENCOUNTER — Emergency Department: Admission: EM | Admit: 2024-09-17 | Discharge: 2024-09-18 | Disposition: A | Discharge status: Home / Self Care

## 2024-09-17 ENCOUNTER — Other Ambulatory Visit: Payer: Self-pay

## 2024-09-17 ENCOUNTER — Encounter: Payer: Self-pay | Admitting: Emergency Medicine

## 2024-09-17 DIAGNOSIS — D72829 Elevated white blood cell count, unspecified: Secondary | ICD-10-CM | POA: Insufficient documentation

## 2024-09-17 DIAGNOSIS — A0472 Enterocolitis due to Clostridium difficile, not specified as recurrent: Secondary | ICD-10-CM | POA: Diagnosis not present

## 2024-09-17 DIAGNOSIS — K625 Hemorrhage of anus and rectum: Secondary | ICD-10-CM | POA: Diagnosis present

## 2024-09-17 DIAGNOSIS — R197 Diarrhea, unspecified: Secondary | ICD-10-CM | POA: Diagnosis not present

## 2024-09-17 LAB — COMPREHENSIVE METABOLIC PANEL WITH GFR
ALT: 16 U/L (ref 0–44)
AST: 24 U/L (ref 15–41)
Albumin: 3.9 g/dL (ref 3.5–5.0)
Alkaline Phosphatase: 64 U/L (ref 38–126)
Anion gap: 12 (ref 5–15)
BUN: 8 mg/dL (ref 6–20)
CO2: 21 mmol/L — ABNORMAL LOW (ref 22–32)
Calcium: 8.9 mg/dL (ref 8.9–10.3)
Chloride: 106 mmol/L (ref 98–111)
Creatinine, Ser: 0.9 mg/dL (ref 0.44–1.00)
GFR, Estimated: >60 mL/min (ref >60)
Glucose, Bld: 100 mg/dL — ABNORMAL HIGH (ref 70–99)
Potassium: 4.1 mmol/L (ref 3.5–5.1)
Sodium: 139 mmol/L (ref 135–145)
Total Bilirubin: 0.7 mg/dL (ref 0.0–1.2)
Total Protein: 7.4 g/dL (ref 6.5–8.1)

## 2024-09-17 LAB — URINALYSIS, ROUTINE W REFLEX MICROSCOPIC
Bilirubin Urine: NEGATIVE
Glucose, UA: NEGATIVE mg/dL
Hgb urine dipstick: NEGATIVE
Ketones, ur: NEGATIVE mg/dL
Leukocytes,Ua: NEGATIVE
Nitrite: NEGATIVE
Protein, ur: NEGATIVE mg/dL
Specific Gravity, Urine: 1.01 (ref 1.005–1.030)
pH: 6 (ref 5.0–8.0)

## 2024-09-17 LAB — CBC
HCT: 42.3 % (ref 36.0–46.0)
Hemoglobin: 14.3 g/dL (ref 12.0–15.0)
MCH: 30.7 pg (ref 26.0–34.0)
MCHC: 33.8 g/dL (ref 30.0–36.0)
MCV: 90.8 fL (ref 80.0–100.0)
Platelets: 327 K/uL (ref 150–400)
RBC: 4.66 MIL/uL (ref 3.87–5.11)
RDW: 12 % (ref 11.5–15.5)
WBC: 10.7 K/uL — ABNORMAL HIGH (ref 4.0–10.5)
nRBC: 0 % (ref 0.0–0.2)

## 2024-09-17 LAB — C DIFFICILE QUICK SCREEN W PCR REFLEX
C Diff antigen: POSITIVE — AB
C Diff interpretation: DETECTED
C Diff toxin: POSITIVE — AB

## 2024-09-17 LAB — LIPASE, BLOOD: Lipase: 25 U/L (ref 11–51)

## 2024-09-17 NOTE — ED Provider Notes (Signed)
 [facility] Provider Note    Event Date/Time   First MD Initiated Contact with Patient 09/17/24 2100     (approximate)   History   Rectal Bleeding  Pt arrives POV, ambulatory to triage, gait steady, no acute distress noted c/o rectal bleeding today, bright red blood in stool. Pt reports having diarrhea for 3 weeks. Recently seen by GI who advised her to come get blood work and c.diff. pt has been on antibiotics for a few weeks. Colonoscopy last Monday. Pt reports mild generalized abd discomfort. Denies n/v.    HPI Allison [name] is a 48 y.o. female PMH bipolar disorder, anxiety, obesity, GERD presents for evaluation of rectal bleeding - Patient had a episode of bloody diarrhea around 3 PM today.  No recurrence after that.  Did have a very mild streaking of blood in stool earlier in the morning as well.  Not on blood thinners.  Has been having some mild abdominal discomfort over the past 3 weeks while she has been having these recurrent episodes of diarrhea. - No fever - Status post appendectomy - Called her doctor and was told to come to the emergency department and evaluate for possible C. Difficile - Start Keflex today  Per chart review, last seen in our emergency department on 08/23/2024 for rectal bleeding.  CT showed mucosal hyperemia to the distal rectum concerning for infectious versus inflammatory proctitis.  Labs overall reassuring, inflammatory markers negative, hCG negative.  Discussed with GI who recommended Flagyl .  Subsequently been in GI clinic on 09/03/2024  Patient had colonoscopy on 09/08/2024.  Noted nonbleeding internal hemorrhoids.  Erythematous mucosa in rectum which was biopsied.  Ileum was unremarkable.  Sigmoid, descending, splenic flexure, transverse, hepatic flexure, ascending colon, cecum, rectosigmoid colon were unremarkable.     Physical Exam   Triage Vital Signs: ED Triage Vitals [09/17/24 1919]  Encounter Vitals Group      BP 115/78     Girls Systolic BP Percentile      Girls Diastolic BP Percentile      Boys Systolic BP Percentile      Boys Diastolic BP Percentile      Pulse Rate 86     Resp 18     Temp 97.8 F (36.6 C)     Temp Source Oral     SpO2 96 %     Weight      Height      Head Circumference      Peak Flow      Pain Score 2     Pain Loc      Pain Education      Exclude from Growth Chart     Most recent vital signs: Vitals:   09/17/24 2330 09/18/24 0000  BP: 97/70 101/67  Pulse: 81 68  Resp:  16  Temp:    SpO2: 97% 98%     General: Awake, no distress.  CV:  Good peripheral perfusion. RRR, RP 2+ Resp:  Normal effort. CTAB Abd:  No distention.  Very mild tenderness in right lower quadrant, trace tenderness in right upper quadrant Rectal (chaperoned):  No masses, no blood, no fissures   ED Results / Procedures / Treatments   Labs (all labs ordered are listed, but only abnormal results are displayed) Labs Reviewed  C DIFFICILE QUICK SCREEN W PCR REFLEX   - Abnormal; Notable for the following components:      Result Value   C Diff antigen POSITIVE (*)  C Diff toxin POSITIVE (*)    All other components within normal limits  COMPREHENSIVE METABOLIC PANEL WITH GFR - Abnormal; Notable for the following components:   CO2 21 (*)    Glucose, Bld 100 (*)    All other components within normal limits  CBC - Abnormal; Notable for the following components:   WBC 10.7 (*)    All other components within normal limits  URINALYSIS, ROUTINE W REFLEX MICROSCOPIC - Abnormal; Notable for the following components:   Color, Urine YELLOW (*)    APPearance HAZY (*)    All other components within normal limits  GASTROINTESTINAL PANEL BY PCR, STOOL (REPLACES STOOL CULTURE)  LIPASE, BLOOD  POC URINE PREG, ED     EKG  N/a   RADIOLOGY N/a    PROCEDURES:  Critical Care performed: No  Procedures   MEDICATIONS ORDERED IN ED: Medications - No data to display   IMPRESSION /  MDM / ASSESSMENT AND PLAN / ED COURSE  I reviewed the triage vital signs and the nursing notes.                              DDX/MDM/AP: Differential diagnosis includes, but is not limited to, bleeding internal hemorrhoid, consider C. difficile or other infectious colitis, reassured by recent overall unremarkable colonoscopy beyond erythematous mucosa in rectum and nonbleeding internal hemorrhoids.  Do not suspect brisk upper GI bleed and is very well-appearing patient.  Status post appendectomy, no concern for appendicitis.  Plan: - Labs - Stool studies - Denies need for any pain control - No clear indication for repeat abdominal imaging at this time, will reassess  Patient's presentation is most consistent with acute presentation with potential threat to life or bodily function.  The patient is on the cardiac monitor to evaluate for evidence of arrhythmia and/or significant heart rate changes.  ED course below.  Laboratory workup unremarkable except for C. difficile testing which was positive.  Will discontinue patient's current regimen of Keflex and start her on oral vancomycin.  Plan for PMD and outpatient GI follow-up.  ED return precautions in place.  Very well-appearing stable vital signs here and agrees with plan.  Very small amount of b blood on stool sample here, overall minimal amount, not clinically concern for significant hemorrhage at this time, suspect treating underlying infection will help resolve this problem.  Clinical Course as of 09/18/24 0023  Wed Sep 17, 2024  2137 CBC with very mild leukocytosis, no anemia [MM]  2137 Urinalysis with no evidence of infection [MM]  2137 CMP reviewed, unremarkable, no isolated elevated BUN  Lipase wnl [MM]  Thu Sep 18, 2024  0016 C Diff antigen(!): POSITIVE [MM]  0016 C Diff toxin(!): POSITIVE [MM]  0016 Patient notified, will treat with oral vancomycin  Plan for PMD follow-up, ED return precautions placed.  Patient agrees with  plan.  Very well-appearing with stable vital signs at time of discharge. [MM]    Clinical Course User Index [MM] Clarine Ozell LABOR, MD     FINAL CLINICAL IMPRESSION(S) / ED DIAGNOSES   Final diagnoses:  C. difficile colitis     Rx / DC Orders   ED Discharge Orders          Ordered    vancomycin (VANCOCIN) 50 mg/mL SOLN oral solution  Every 6 hours        09/18/24 0021  Note:  This document was prepared using Dragon voice recognition software and may include unintentional dictation errors.   Clarine Ozell LABOR, MD 09/18/24 [phone]

## 2024-09-17 NOTE — ED Triage Notes (Signed)
 Pt arrives POV, ambulatory to triage, gait steady, no acute distress noted c/o rectal bleeding today, bright red blood in stool. Pt reports having diarrhea for 3 weeks. Recently seen by GI who advised her to come get blood work and c.diff. pt has been on antibiotics for a few weeks. Colonoscopy last Monday. Pt reports mild generalized abd discomfort. Denies n/v.

## 2024-09-18 LAB — GASTROINTESTINAL PANEL BY PCR, STOOL (REPLACES STOOL CULTURE)

## 2024-09-18 LAB — POC URINE PREG, ED: Preg Test, Ur: NEGATIVE

## 2024-09-18 MED ORDER — VANCOMYCIN 50 MG/ML ORAL SOLUTION: 125.0000 mg | Freq: Four times a day (QID) | ORAL | 0 refills | Status: AC

## 2024-09-18 NOTE — Discharge Instructions (Addendum)
 Your evaluation in the emergency department was overall reassuring, though you did test positive for C. difficile.  I recommend stopping your Keflex and I have started you on a different oral antibiotic to treat this-please take the full course as prescribed and follow-up with your primary care provider and gastroenterologist for reevaluation.  Return to the emergency department with any new or worsening symptoms.

## 2024-09-23 ENCOUNTER — Ambulatory Visit: Admitting: Nurse Practitioner

## 2024-09-23 VITALS — BP 100/70 | HR 72 | Temp 98.0°F | Ht 64.0 in | Wt 206.4 lb

## 2024-09-23 DIAGNOSIS — Z09 Encounter for follow-up examination after completed treatment for conditions other than malignant neoplasm: Secondary | ICD-10-CM | POA: Diagnosis not present

## 2024-09-23 DIAGNOSIS — A0472 Enterocolitis due to Clostridium difficile, not specified as recurrent: Secondary | ICD-10-CM | POA: Diagnosis not present

## 2024-09-23 NOTE — Patient Instructions (Signed)
 Nice to see you today  I do not need any tests today Follow up with me as scheduled, sooner if you need me

## 2024-09-23 NOTE — Progress Notes (Signed)
 Established Patient Office Visit  Subjective   Patient ID: Allison [name], female    DOB: 26-Dec-1975  Age: 48 y.o. MRN: [id number]  Chief Complaint  Patient presents with   Hospitalization Follow-up    Pt complains of feeling much better since hospital stay.     HPI  Discussed the use of AI scribe software for clinical note transcription with the patient, who gave verbal consent to proceed.  History of Present Illness Allison [name] is a 48 year old female who presents with diarrhea and abdominal symptoms following antibiotic use.  She has experienced a month-long history of gastrointestinal symptoms, including diarrhea and abdominal discomfort. A recent visit to the emergency department on October 15th confirmed a Clostridioides difficile infection. She has been on five different antibiotics in the past month, with the most recent being vancomycin, which she started on October 15th.  Since starting vancomycin, her symptoms have improved significantly. She has had no episodes of diarrhea since Thursday afternoon, and her stools have returned to normal. No abdominal pain, fever, or chills during this period. She has resumed normal activities, including returning to work.  Her appetite had been poor, but she notes she has not lost any weight. She is currently still taking vancomycin, with a course prescribed for either ten or fourteen days.  A recent follow-up with her gastroenterologist is pending as he is currently on vacation. Previous biopsies were negative for ulcerative colitis and Crohn's disease. She has a history of joint pain and was worried about potential inflammatory bowel disease.    Review of Systems  Constitutional:  Negative for chills and fever.  Respiratory:  Negative for shortness of breath.   Cardiovascular:  Negative for chest pain.  Gastrointestinal:  Negative for abdominal pain, diarrhea, nausea and vomiting.      Objective:     BP 100/70    Pulse 72   Temp 98 F (36.7 C) (Oral)   Ht 5' 4 (1.626 m)   Wt 206 lb 6.4 oz (93.6 kg)   SpO2 99%   BMI 35.43 kg/m    Physical Exam Vitals and nursing note reviewed.  Constitutional:      Appearance: Normal appearance.  Cardiovascular:     Rate and Rhythm: Normal rate and regular rhythm.     Heart sounds: Normal heart sounds.  Pulmonary:     Effort: Pulmonary effort is normal.     Breath sounds: Normal breath sounds.  Abdominal:     General: Bowel sounds are normal. There is no distension.     Tenderness: There is no abdominal tenderness.  Neurological:     Mental Status: She is alert.      No results found for any visits on 09/23/24.    The 10-year ASCVD risk score (Arnett DK, et al., 2019) is: 0.6%    Assessment & Plan:   Problem List Items Addressed This Visit       Digestive   C. difficile colitis - Primary   Relevant Medications   vancomycin (VANCOCIN) 125 MG capsule     Other   Hospital discharge follow-up  Assessment and Plan Assessment & Plan Clostridioides difficile colitis Recent diagnosis post-antibiotics. Symptoms resolved, no diarrhea, normal stools. Biopsies negative for ulcerative colitis and Crohn's disease. Labs normal. - Continue vancomycin as prescribed. - Follow up with GI specialist post-vacation. - Coordinate with GI nurse for scheduling. - Maintain contact precautions. - Did review emergency department note along with labs.   Return if symptoms  worsen or fail to improve, for As scheduled .    Adina Crandall, NP

## 2024-10-26 ENCOUNTER — Encounter: Payer: Self-pay | Admitting: Nurse Practitioner

## 2024-10-29 NOTE — Telephone Encounter (Signed)
 I  spoke with pt and  she said she had a 4pm appt today with CHRISTELLA Crandall NP and was about to pull in parking lot. There are no available appts at [facility] this afternoon. Pt said she made appt on my chart and got confirmation. Not sure if was computer issue or what but pt is going to [facility] now to be seen. Pt is understanding.Sending to CHRISTELLA Crandall NP as RICK.

## 2024-10-29 NOTE — Telephone Encounter (Signed)
 noted

## 2025-01-02 ENCOUNTER — Other Ambulatory Visit: Payer: Self-pay | Admitting: Nurse Practitioner

## 2025-01-02 ENCOUNTER — Telehealth: Payer: Self-pay | Admitting: Nurse Practitioner

## 2025-01-02 DIAGNOSIS — F3170 Bipolar disorder, currently in remission, most recent episode unspecified: Secondary | ICD-10-CM

## 2025-01-02 NOTE — Telephone Encounter (Signed)
 cariprazine  (VRAYLAR ) 3 MG capsule  lamoTRIgine  (LAMICTAL ) 200 MG tablet  Pt would need these medications to be refilled.

## 2025-01-05 ENCOUNTER — Encounter: Payer: Self-pay | Admitting: Nurse Practitioner

## 2025-02-16 ENCOUNTER — Encounter: Payer: Self-pay | Admitting: Nurse Practitioner
